# Patient Record
Sex: Male | Born: 1960 | Race: Black or African American | Hispanic: No | Marital: Married | Smoking: Never smoker
Health system: Southern US, Community
[De-identification: ages and names within clinical notes are randomized; demographics above are authoritative.]

## PROBLEM LIST (undated history)

## (undated) DIAGNOSIS — I10 Essential (primary) hypertension: Secondary | ICD-10-CM

## (undated) DIAGNOSIS — E119 Type 2 diabetes mellitus without complications: Secondary | ICD-10-CM

## (undated) DIAGNOSIS — F209 Schizophrenia, unspecified: Secondary | ICD-10-CM

## (undated) HISTORY — PX: TONSILLECTOMY: SUR1361

---

## 2007-10-29 ENCOUNTER — Emergency Department (HOSPITAL_BASED_OUTPATIENT_CLINIC_OR_DEPARTMENT_OTHER): Admission: EM | Admit: 2007-10-29 | Discharge: 2007-10-29 | Payer: Self-pay | Admitting: Emergency Medicine

## 2007-11-02 DIAGNOSIS — F329 Major depressive disorder, single episode, unspecified: Secondary | ICD-10-CM | POA: Insufficient documentation

## 2007-11-04 ENCOUNTER — Ambulatory Visit: Payer: Self-pay | Admitting: Critical Care Medicine

## 2007-11-04 DIAGNOSIS — Q330 Congenital cystic lung: Secondary | ICD-10-CM | POA: Insufficient documentation

## 2007-11-04 DIAGNOSIS — E785 Hyperlipidemia, unspecified: Secondary | ICD-10-CM | POA: Insufficient documentation

## 2008-02-01 ENCOUNTER — Telehealth (INDEPENDENT_AMBULATORY_CARE_PROVIDER_SITE_OTHER): Payer: Self-pay | Admitting: *Deleted

## 2008-02-02 ENCOUNTER — Ambulatory Visit: Payer: Self-pay | Admitting: Critical Care Medicine

## 2008-02-04 ENCOUNTER — Ambulatory Visit (HOSPITAL_COMMUNITY): Admission: RE | Admit: 2008-02-04 | Discharge: 2008-02-04 | Payer: Self-pay | Admitting: Critical Care Medicine

## 2008-02-08 ENCOUNTER — Encounter: Payer: Self-pay | Admitting: Critical Care Medicine

## 2008-10-03 ENCOUNTER — Ambulatory Visit: Payer: Self-pay | Admitting: Critical Care Medicine

## 2008-10-04 ENCOUNTER — Encounter: Payer: Self-pay | Admitting: Critical Care Medicine

## 2008-10-04 ENCOUNTER — Ambulatory Visit: Payer: Self-pay | Admitting: Diagnostic Radiology

## 2008-10-04 ENCOUNTER — Ambulatory Visit (HOSPITAL_BASED_OUTPATIENT_CLINIC_OR_DEPARTMENT_OTHER): Admission: RE | Admit: 2008-10-04 | Discharge: 2008-10-04 | Payer: Self-pay | Admitting: Critical Care Medicine

## 2008-10-19 ENCOUNTER — Ambulatory Visit: Payer: Self-pay | Admitting: Thoracic Surgery (Cardiothoracic Vascular Surgery)

## 2008-10-20 ENCOUNTER — Telehealth: Payer: Self-pay | Admitting: Critical Care Medicine

## 2008-12-28 ENCOUNTER — Ambulatory Visit: Payer: Self-pay | Admitting: Internal Medicine

## 2009-07-26 IMAGING — CT CT CHEST W/ CM
2 of 3 series · 15 of 36 positions shown, 18 images · IV contrast (agent unspecified)
Comparison: MR chest 02/04/2008 and CT chest 10/29/2007

CLINICAL DATA: Bronchogenic cyst.  Chest pain and discomfort.

CT CHEST WITH CONTRAST
TECHNIQUE: Multidetector CT imaging of the chest was performed
following the standard protocol during bolus administration of
intravenous contrast.
Contrast: 100 ml [id number]-ASS

[Series 2: chest 5.0 b31f · axial · 0.65mm/px · z∈[-350,-50]mm · 12 of 72 slices shown, 15 images]
[im 6/72  mediastinal]
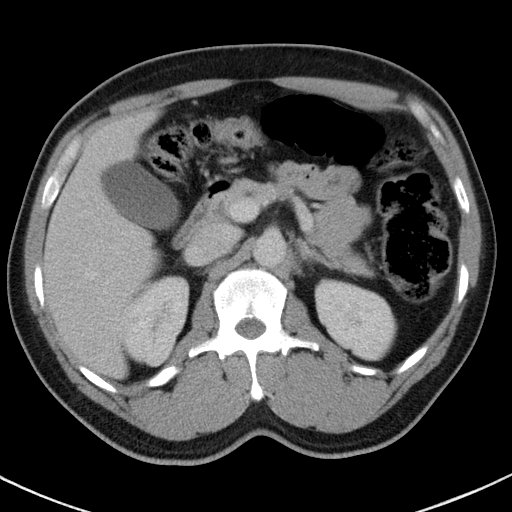
[im 6/72  lung]
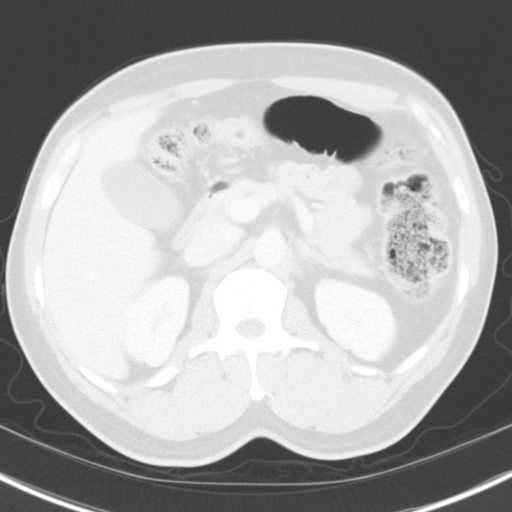
[im 11/72  lung]
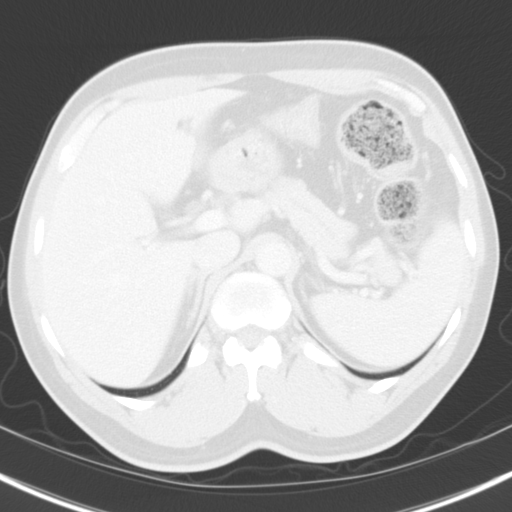
[im 16/72  lung]
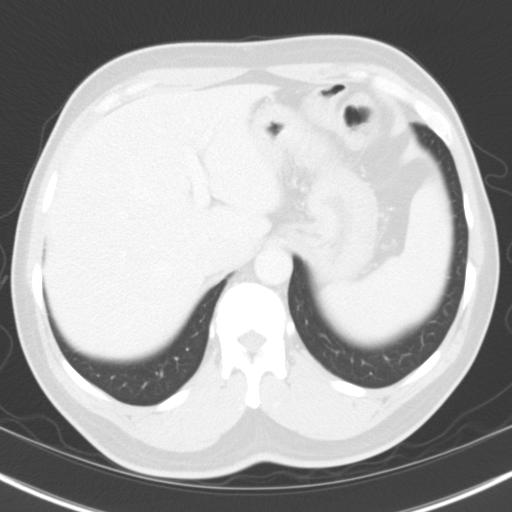
[im 22/72  lung]
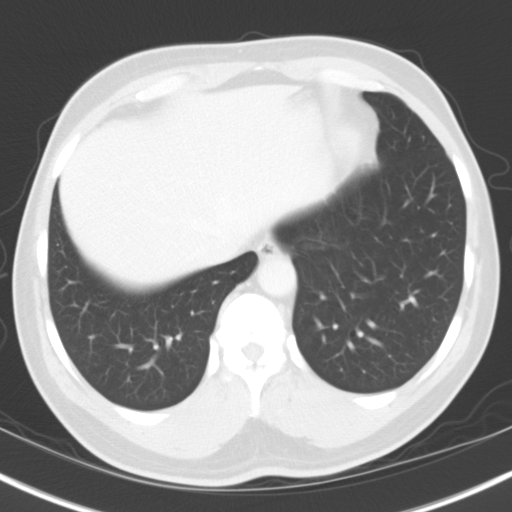
[im 27/72  mediastinal]
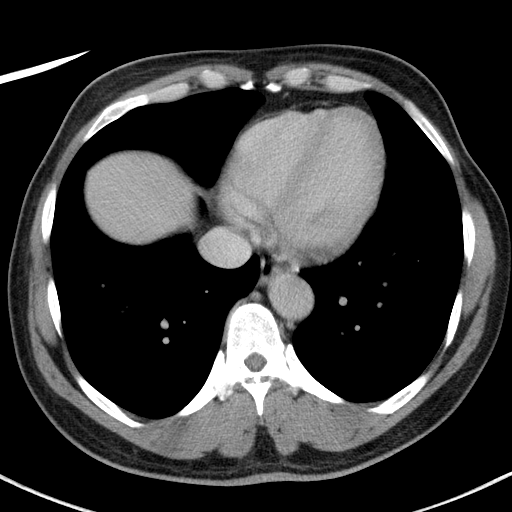
[im 27/72  lung]
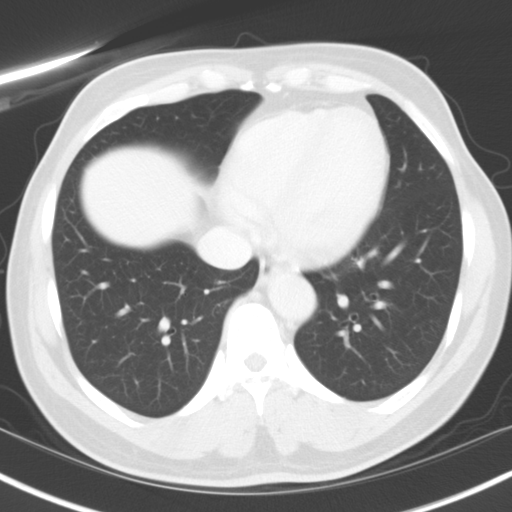
[im 32/72  lung]
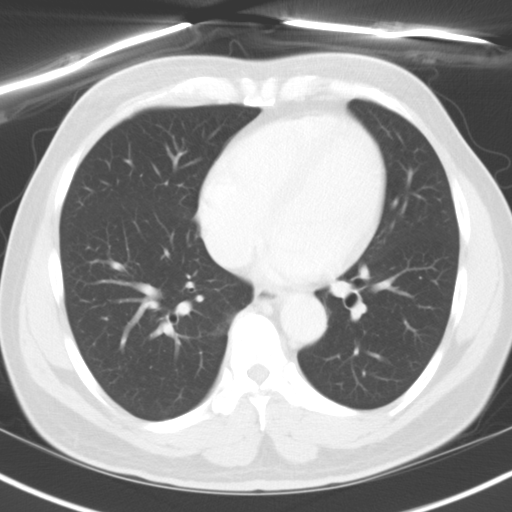
[im 40/72  lung]
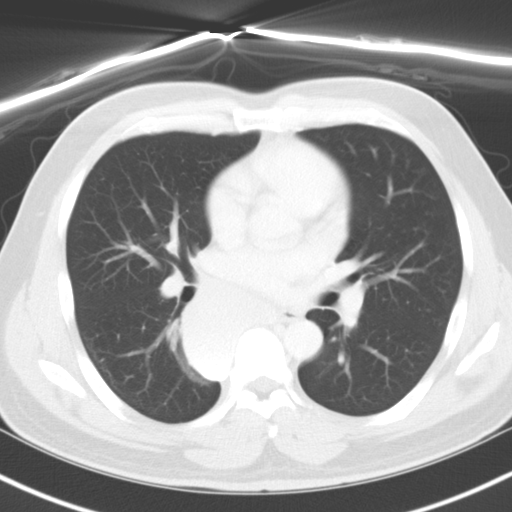
[im 45/72  lung]
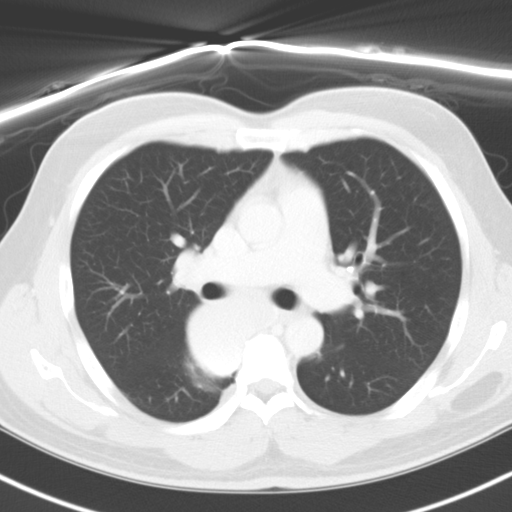
[im 50/72  mediastinal]
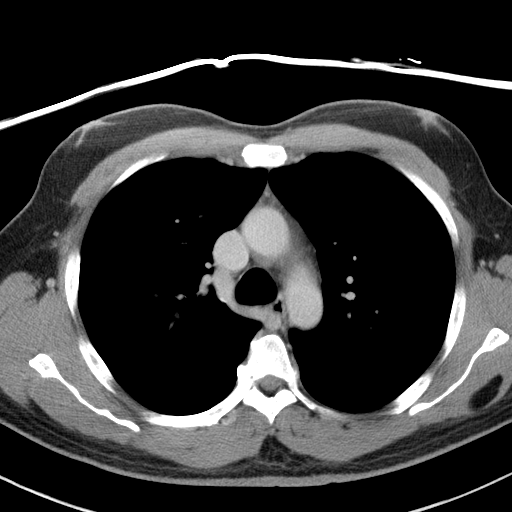
[im 50/72  lung]
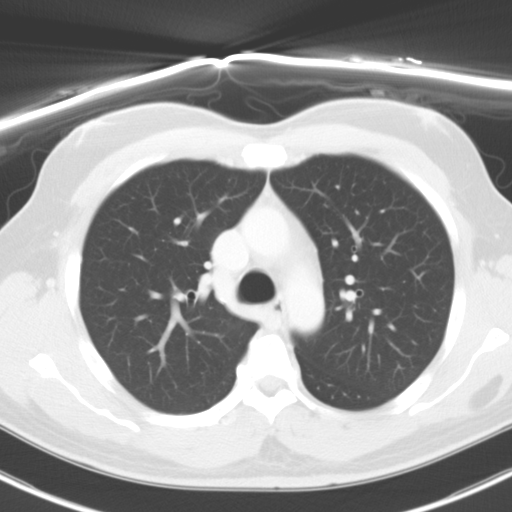
[im 56/72  lung]
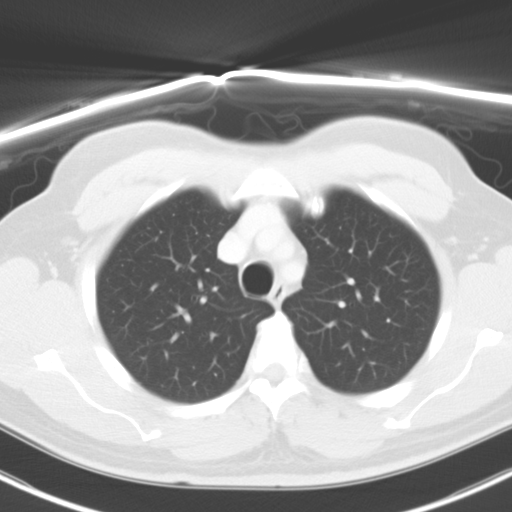
[im 61/72  lung]
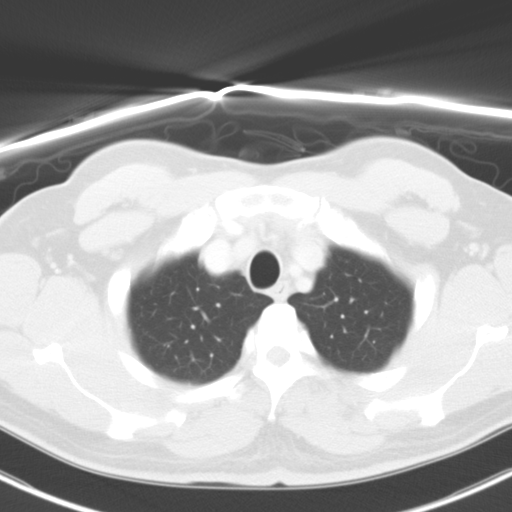
[im 66/72  lung]
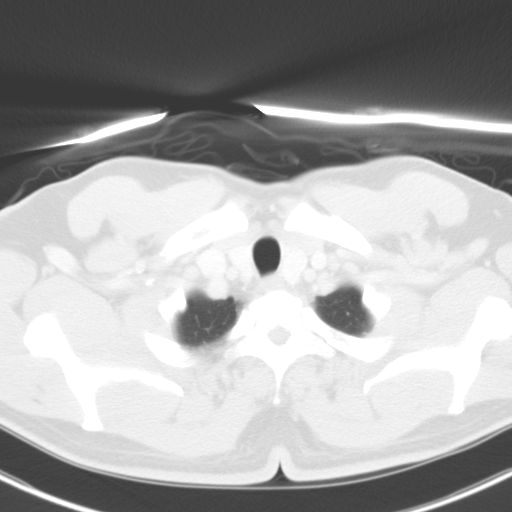

[Series 6: chest 3.0 coronal · coronal · 0.67mm/px · 3 of 84 slices shown]
[im 17/84  lung]
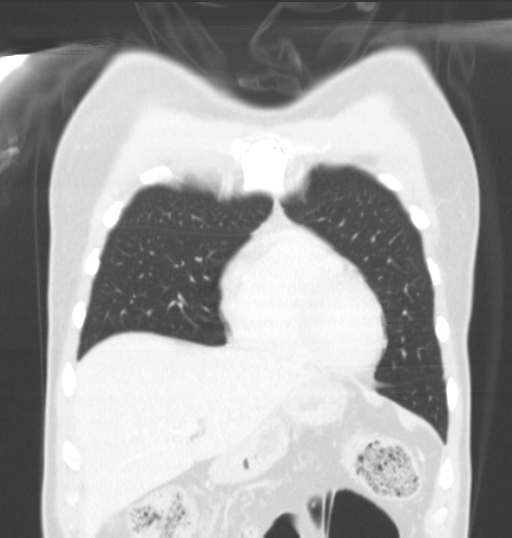
[im 34/84  lung]
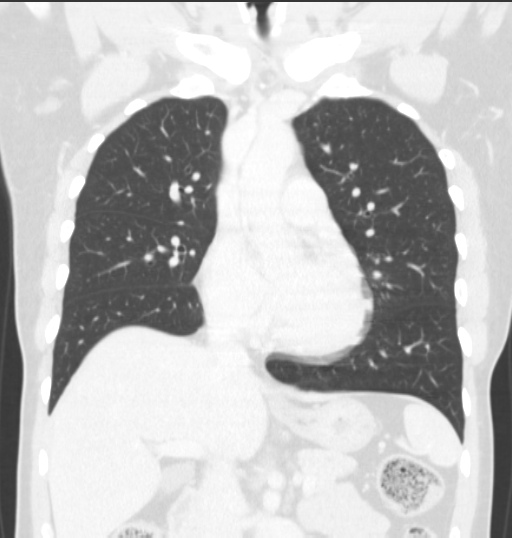
[im 50/84  lung]
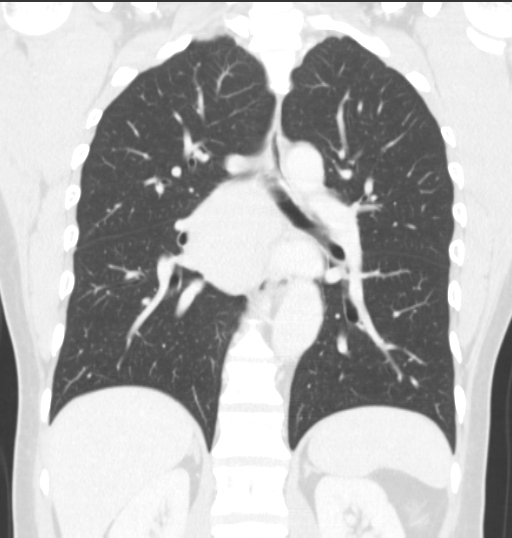

[15 of 36 positions shown; findings below may reference images not displayed]

FINDINGS: No pathologically enlarged mediastinal, hilar or axillary
lymph nodes.  There is bilateral gynecomastia.  A kidney-shaped
mass in the subcarinal region extends into the right hemithorax,
and measures 5.4 x 7.1 cm, stable.  It measures 100 HU, and there
is layering high-density debris.  Heart size normal.  No
pericardial effusion. A lipoma is incidentally noted in the left
infraspinatus muscle.

Mild biapical scarring.  Scarring is seen in the medial right lower
lobe.  Lungs are otherwise clear.  No pleural fluid.  Airway is
unremarkable.

Incidental imaging of the upper abdomen shows low attenuation
lesions in the liver measuring up to approximately 1 cm in size, in
the left hepatic lobe.  These are stable and likely small
hemangiomas.  No worrisome lytic or sclerotic lesions.
IMPRESSION: Complex cyst in the right subcarinal region is stable in size, and
most consistent with a bronchogenic/GI duplication cyst.

## 2010-04-29 ENCOUNTER — Encounter: Payer: Self-pay | Admitting: Critical Care Medicine

## 2010-08-20 NOTE — Consult Note (Signed)
NEW PATIENT CONSULTATION   Terry Padilla, Terry Padilla  DOB:  1960/10/13                                        October 19, 2008  CHART #:  [id number]   REASON FOR CONSULTATION:  Bronchogenic cyst with pain.   HISTORY OF PRESENT ILLNESS:  The patient is a 50 year old gentleman  originally from [place] who presents with a chief complaint of chest  pain, history regarding his mediastinal mass dates back about a year  ago, when he was having a sensation of fullness after eating.  He says  he did not have any dysphagia but after he would eat he would feel full,  had some chest discomfort, also feeling like he had the belch.  Workup  at that time revealed a large mediastinal cystic structure.  His  symptoms from that improved, and he did well until about 4 months ago,  when he started having chest pain.  He described this as a discomfort as  sharp pain that is changed with changes in position, particularly  associated with eating.  He did have a pleuritic component with pain  with deep breathing.  He has been taking Advil and that now has improved  over the time.  Repeat CT was done and he was referred to Dr. Delford Field.  The CT showed a large cystic mass in the mediastinum, in the subcarinal  region biased to the right.  This had different densities but was thin  walled with no enhancement.   PAST MEDICAL HISTORY:  Significant for hyperlipidemia, depression, and  schizophrenia, prostatitis, mediastinal cyst.   PAST SURGICAL HISTORY:  Significant for tonsillectomy.   FAMILY HISTORY:  Noncontributory.   SOCIAL HISTORY:  He is married with 2 children.  He works with the  [employer] as an Art gallery manager.  He does not smoke or drink.   REVIEW OF SYSTEMS:  Other than what was noted in the history and  physical, the patient denies a history of excessive bleeding, clotting,  stroke, or TIA symptoms, any other shortness of breath either at rest or  exertion, any other chest pain other than  that described in the HPI.  He  is not having any dysphagia and no indigestion or heartburn.  All other  systems are negative.   PHYSICAL EXAMINATION:  General:  The patient is a tall, thin, 48-year-  old male in no acute distress.  Vital Signs:  Blood pressure is 126/76,  pulse 72, respirations 16, his oxygen saturation is 99% on room air.  Neurologic:  He is alert.  He is oriented x3.  He has had no focal  deficits.  HEENT: Unremarkable.  Neck:  Without thyromegaly, adenopathy,  or bruits.  Cardiac:  Regular rate and rhythm.  Normal S1 and S2.  No  rubs, murmurs, or gallops.  Lungs:  Clear with equal breath sounds  bilaterally.  Extremities:  Without clubbing, cyanosis, or edema.   IMAGING DATA:  CT is reviewed.  It shows a thin-walled cystic structure,  which is fluid filled with variable enhancement measuring maximally 7 cm  x 5.4 cm.  This is an esophageal reflux and it abuts the subcarinal  space as well as the esophagus.  It is adjacent to the spine in the left  atrium and does project into the right chest.   IMPRESSION:  The patient is a  50 year old gentleman with a mediastinal  cyst.  This is most likely is a bronchogenic cyst, although it could  possibly be a duplication cyst.  In either event, I have recommended  resection due to the risk of secondary infection.  I would at the time  of surgery recommend bronchoscopy to see if there are any bronchial  connections followed by a right VATS with a mini thoracotomy and  resection of the cyst.  There is a possibility that he could require a  thoracotomy or division of a rib to assist with exposure given the size  of the cyst and that would greatly depend on whether there was any  adhesions to surrounding structures.  I did discuss with him the  possibility of secondary infection or a low likelihood but not  impossible risk of malignant transformation if left untreated.  I  discussed with him potential risk of the surgery.  He  does understand  the need for general anesthesia, operative approach to be used, expected  hospital stay, and overall recovery.  He does understand the risks  include but not be limited to a very low risk of death as well as risk  of MI, DVT, PE, bleeding, possible need for transfusions, infections,  air leak, esophageal injury or other unforeseeable complications.  He  understands these risks.  He has not currently decided as to whether he  wants to proceed with surgery.  I strongly encouraged him to but he  wishes to speak with Dr. Delford Field once again, and I gave him my card with  number.  I will be happy to answer any questions for him and be happy to  schedule surgery at anytime that is convenient for him if he does choose  to proceed.   Salvatore Decent Dorris Fetch, M.D.  Electronically Signed   SCH/MEDQ  D:  10/19/2008  T:  10/20/2008  Job:  [id number]   cc:   Charlcie Cradle. Delford Field, MD, FCCP  Robert P. Merla Riches, M.D.

## 2011-01-03 LAB — CBC
Hemoglobin: 14.3
MCHC: 34.6
MCV: 88.2
RBC: 4.68
RDW: 12

## 2011-01-03 LAB — DIFFERENTIAL
Basophils Absolute: 0
Basophils Relative: 0
Eosinophils Absolute: 0.2
Eosinophils Relative: 2
Lymphocytes Relative: 31
Neutro Abs: 3.9
Neutrophils Relative %: 59

## 2011-01-03 LAB — BASIC METABOLIC PANEL
BUN: 13
Creatinine, Ser: 1.2
Glucose, Bld: 106 — ABNORMAL HIGH
Sodium: 140

## 2011-01-03 LAB — POCT CARDIAC MARKERS: Troponin i, poc: <0.05

## 2013-02-07 ENCOUNTER — Ambulatory Visit (INDEPENDENT_AMBULATORY_CARE_PROVIDER_SITE_OTHER): Payer: Commercial Managed Care - PPO | Admitting: Family Medicine

## 2013-02-07 VITALS — BP 130/80 | HR 71 | Temp 98.1°F | Resp 16 | Ht 76.0 in | Wt 231.0 lb

## 2013-02-07 DIAGNOSIS — R05 Cough: Secondary | ICD-10-CM

## 2013-02-07 NOTE — Patient Instructions (Signed)
Check your blood pressure once a week or so and keep a record.  We would like for your to run around 140/85 or less.  If you find that you are consistently above this number please start on the medication that you were given.    It would be a good idea to re-establish care with a primary care doctor and have a complete physical.

## 2013-02-07 NOTE — Progress Notes (Signed)
[facility] [address], [place] [address] [phone]  Date:  02/07/2013   Name:  Terry Padilla   DOB:  01-18-1961   MRN:  [id number]  PCP:  No primary provider on file.    Chief Complaint: Blood Pressure Check   History of Present Illness:  Terry Padilla is a 52 y.o. very pleasant male patient who presents with the following:  Here today for a BP check.  2 days ago he noted a HA.  He took some advil and went to sleep.  His wife checked his BP with a wrist cuff and he was running 170/130.  He went to a doctor yesterday and his BP was 156/96.  Yesterday he was rx a medication but actually did not start it.  He is not sure of what medication it was.   He would like to try adjusting his diet and see if he can manage his BP without drugs.  Also plans to get into a better exercise program  He has never been dx with HTN in the past.  He does note a family history of HTN in both his parents The HA is resolved.  No CP.   Otherwise he feels well He does not have a PCP now and has not had a CPE in some time  Patient Active Problem List   Diagnosis Date Noted  . HYPERLIPIDEMIA 11/04/2007  . 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 DEPRESSION 11/02/2007    History reviewed. No pertinent past medical history.  History reviewed. No pertinent past surgical history.  History  Substance Use Topics  . Smoking status: Never Smoker   . Smokeless tobacco: Not on file  . Alcohol Use: No    History reviewed. No pertinent family history.  Not on File  Medication list has been reviewed and updated.  No current outpatient prescriptions on file prior to visit.   No current facility-administered medications on file prior to visit.    Review of Systems:  As per HPI- otherwise negative.   Physical Examination: Filed Vitals:   02/07/13 1357  BP: 130/80  Pulse: 71  Temp: 98.1 F (36.7 C)  Resp: 16   Filed Vitals:   02/07/13 1357  Height: 6\' 4"  (1.93 m)   Weight: 231 lb (104.781 kg)   Body mass index is 28.13 kg/(m^2). Ideal Body Weight: Weight in (lb) to have BMI = 25: 205  GEN: WDWN, NAD, Non-toxic, A & O x 3, tall, looks well HEENT: Atraumatic, Normocephalic. Neck supple. No masses, No LAD. Ears and Nose: No external deformity. CV: RRR, No M/G/R. No JVD. No thrill. No extra heart sounds. PULM: CTA B, no wheezes, crackles, rhonchi. No retractions. No resp. distress. No accessory muscle use. EXTR: No c/c/e NEURO Normal gait.  PSYCH: Normally interactive. Conversant. Not depressed or anxious appearing.  Calm demeanor.    Assessment and Plan: Elevated BP  Today his BP looks ok. He would like to avoid medication if possible.  Encouraged him to decrease his salt intake, exercise more and check his BP at least once a week.  If his BP is running more than 140/90 on a consistent basis he should start the medication.   Encouraged a CPE soon  Signed Abbe Amsterdam, MD

## 2013-11-03 ENCOUNTER — Encounter (HOSPITAL_COMMUNITY): Payer: Self-pay | Admitting: Emergency Medicine

## 2013-11-03 ENCOUNTER — Emergency Department (HOSPITAL_COMMUNITY)
Admission: EM | Admit: 2013-11-03 | Discharge: 2013-11-04 | Disposition: A | Discharge status: Other Acute Inpatient Hospital | Payer: Commercial Managed Care - PPO | Attending: Emergency Medicine | Admitting: Emergency Medicine

## 2013-11-03 DIAGNOSIS — F319 Bipolar disorder, unspecified: Secondary | ICD-10-CM

## 2013-11-03 DIAGNOSIS — F209 Schizophrenia, unspecified: Secondary | ICD-10-CM | POA: Diagnosis not present

## 2013-11-03 DIAGNOSIS — Z046 Encounter for general psychiatric examination, requested by authority: Secondary | ICD-10-CM | POA: Insufficient documentation

## 2013-11-03 DIAGNOSIS — F919 Conduct disorder, unspecified: Secondary | ICD-10-CM | POA: Diagnosis not present

## 2013-11-03 DIAGNOSIS — Z79899 Other long term (current) drug therapy: Secondary | ICD-10-CM | POA: Insufficient documentation

## 2013-11-03 DIAGNOSIS — R4689 Other symptoms and signs involving appearance and behavior: Secondary | ICD-10-CM

## 2013-11-03 DIAGNOSIS — F3112 Bipolar disorder, current episode manic without psychotic features, moderate: Secondary | ICD-10-CM | POA: Diagnosis present

## 2013-11-03 DIAGNOSIS — F259 Schizoaffective disorder, unspecified: Secondary | ICD-10-CM

## 2013-11-03 HISTORY — DX: Schizophrenia, unspecified: F20.9

## 2013-11-03 LAB — COMPREHENSIVE METABOLIC PANEL
ALK PHOS: 80 U/L (ref 39–117)
ALT: 36 U/L (ref 0–53)
AST: 52 U/L — AB (ref 0–37)
Albumin: 4.3 g/dL (ref 3.5–5.2)
Anion gap: 14 (ref 5–15)
BILIRUBIN TOTAL: 0.8 mg/dL (ref 0.3–1.2)
BUN: 13 mg/dL (ref 6–23)
CHLORIDE: 102 meq/L (ref 96–112)
CO2: 25 meq/L (ref 19–32)
Calcium: 9.8 mg/dL (ref 8.4–10.5)
Creatinine, Ser: 1.2 mg/dL (ref 0.50–1.35)
GFR, EST AFRICAN AMERICAN: 79 mL/min — AB (ref >90)
GFR, EST NON AFRICAN AMERICAN: 68 mL/min — AB (ref >90)
GLUCOSE: 118 mg/dL — AB (ref 70–99)
Potassium: 3.8 mEq/L (ref 3.7–5.3)
Sodium: 141 mEq/L (ref 137–147)
Total Protein: 7.7 g/dL (ref 6.0–8.3)

## 2013-11-03 LAB — CBC
HCT: 43.4 % (ref 39.0–52.0)
Hemoglobin: 14.7 g/dL (ref 13.0–17.0)
MCH: 31.2 pg (ref 26.0–34.0)
MCHC: 33.9 g/dL (ref 30.0–36.0)
MCV: 92.1 fL (ref 78.0–100.0)
PLATELETS: 179 10*3/uL (ref 150–400)
RBC: 4.71 MIL/uL (ref 4.22–5.81)
RDW: 12.8 % (ref 11.5–15.5)
WBC: 8 10*3/uL (ref 4.0–10.5)

## 2013-11-03 LAB — RAPID URINE DRUG SCREEN, HOSP PERFORMED
Amphetamines: NOT DETECTED
BENZODIAZEPINES: NOT DETECTED
Barbiturates: NOT DETECTED
Cocaine: NOT DETECTED
Opiates: NOT DETECTED
TETRAHYDROCANNABINOL: NOT DETECTED

## 2013-11-03 LAB — ACETAMINOPHEN LEVEL: Acetaminophen (Tylenol), Serum: <15 ug/mL (ref 10–30)

## 2013-11-03 LAB — ETHANOL

## 2013-11-03 LAB — LITHIUM LEVEL: Lithium Lvl: <0.25 mEq/L — ABNORMAL LOW (ref 0.80–1.40)

## 2013-11-03 LAB — SALICYLATE LEVEL

## 2013-11-03 MED ORDER — QUETIAPINE FUMARATE 300 MG PO TABS
300.0000 mg | ORAL_TABLET | Freq: Every day | ORAL | Status: DC
  Administered 2013-11-03: 300 mg via ORAL
  Filled 2013-11-03: qty 1

## 2013-11-03 MED ORDER — ACETAMINOPHEN 325 MG PO TABS
650.0000 mg | ORAL_TABLET | ORAL | Status: DC | PRN
  Administered 2013-11-03: 650 mg via ORAL
  Filled 2013-11-03: qty 2

## 2013-11-03 MED ORDER — ONDANSETRON HCL 4 MG PO TABS: 4.0000 mg | ORAL_TABLET | Freq: Three times a day (TID) | ORAL | Status: DC | PRN

## 2013-11-03 MED ORDER — LORAZEPAM 2 MG/ML IJ SOLN
INTRAMUSCULAR | Status: AC
  Administered 2013-11-03: 15:00:00
  Filled 2013-11-03: qty 1

## 2013-11-03 MED ORDER — HALOPERIDOL LACTATE 5 MG/ML IJ SOLN
INTRAMUSCULAR | Status: AC
  Administered 2013-11-03: 15:00:00
  Filled 2013-11-03: qty 1

## 2013-11-03 MED ORDER — LITHIUM CARBONATE ER 450 MG PO TBCR
900.0000 mg | EXTENDED_RELEASE_TABLET | Freq: Every day | ORAL | Status: DC
  Administered 2013-11-03: 900 mg via ORAL
  Filled 2013-11-03 (×2): qty 2

## 2013-11-03 MED ORDER — NICOTINE 21 MG/24HR TD PT24
21.0000 mg | MEDICATED_PATCH | Freq: Every day | TRANSDERMAL | Status: DC
  Administered 2013-11-04: 21 mg via TRANSDERMAL
  Filled 2013-11-03: qty 1

## 2013-11-03 MED ORDER — IBUPROFEN 200 MG PO TABS
600.0000 mg | ORAL_TABLET | Freq: Three times a day (TID) | ORAL | Status: DC | PRN
Start: 2013-11-03 — End: 2013-11-04
  Administered 2013-11-03: 600 mg via ORAL
  Filled 2013-11-03: qty 3

## 2013-11-03 MED ORDER — LORAZEPAM 1 MG PO TABS
1.0000 mg | ORAL_TABLET | Freq: Three times a day (TID) | ORAL | Status: DC | PRN
  Administered 2013-11-03 – 2013-11-04 (×2): 1 mg via ORAL
  Filled 2013-11-03 (×2): qty 1

## 2013-11-03 MED ORDER — DIPHENHYDRAMINE HCL 50 MG/ML IJ SOLN
INTRAMUSCULAR | Status: AC
  Administered 2013-11-03: 15:00:00
  Filled 2013-11-03: qty 1

## 2013-11-03 MED ORDER — ZOLPIDEM TARTRATE 5 MG PO TABS
5.0000 mg | ORAL_TABLET | Freq: Every evening | ORAL | Status: DC | PRN
Start: 2013-11-03 — End: 2013-11-03

## 2013-11-03 MED ORDER — ALUM & MAG HYDROXIDE-SIMETH 200-200-20 MG/5ML PO SUSP: 30.0000 mL | ORAL | Status: DC | PRN

## 2013-11-03 NOTE — ED Notes (Signed)
Ok per patient to release valuables to wife Terry Padilla. Valuables returned to wife. Wife states patient has spent over 200,000.00 in the last week.

## 2013-11-03 NOTE — BH Assessment (Signed)
Assessment Note  Terry Padilla is an 53 y.o. male. Pt presents under IVC taken out by his wife. Per IVC, pt has been dx with bipolar and under care of psychiatrist. Per IVC, pt refusing psych meds and [place] highway 10[place]30[place]13. Pt is cooperative and talkative at time of assessment. He says, "I am having trouble with my wife". Pt goes on to tell writer in detail about wife going back on her word when she promised to convert to [place] he and wife traveled to [place] last week and he lost his wallet, credit card and cell phone.  Pt sts that he flew to [place] one time in order to shake Obama's hand so pt could "transfer my power" to President. Pt sts his sister is flying from [place] to [place] today to see pt. Pt sts that if sister doesn't come to visit pt, then their mother will die. 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 When asked whether he spent $200,000 in in the past week, pt sts he did. Pt denies he spent the money on prostitutes. Pt says, "They were people I know.". Pt says he didn't buy gifts for the people. He says he bought them food at the [employer]. Pt sts that he knows he will win the [place]. He says, "I spend money before I have it." Pt denies SI and HI. He denies [facility]. Pt sts he will no longer live with his wife and two children. Pt denies having a psychiatrist and sts that he has no hx of inpatient treatment.   Axis I: Bipolar I Disorder Axis II: Deferred Axis III:  Past Medical History  Diagnosis Date  . Schizophrenia    Axis IV: other psychosocial or environmental problems, problems related to social environment and problems with primary support group Axis V: 31-40 impairment in reality testing  Past Medical History:  Past Medical History  Diagnosis Date  . Schizophrenia     Past Surgical History  Procedure Laterality Date  . Tonsillectomy      Family History: No family history on file.  Social History:  reports that he has  never smoked. He does not have any smokeless tobacco history on file. He reports that he does not drink alcohol or use illicit drugs.  Additional Social History:  Alcohol / Drug Use Pain Medications: see PTA meds list - pt denies abuse Prescriptions: see PTA meds list - pt denies abuse Over the Counter: see PTA meds list - pt denies abuse History of alcohol / drug use?: No history of alcohol / drug abuse  CIWA: CIWA-Ar BP: 148/101 mmHg Pulse Rate: 80 COWS:    Allergies: No Known Allergies  Home Medications:  (Not in a hospital admission)  OB/GYN Status:  No LMP for male patient.  General Assessment Data Location of Assessment: [facility] Is this a Tele or Face-to-Face Assessment?: Face-to-Face Is this an Initial Assessment or a Re-assessment for this encounter?: Initial Assessment Living Arrangements: Spouse/significant other;Children (wife, kids (78 yo daughter, 66 yo son)) Can pt return to current living arrangement?: Yes Admission Status: Involuntary Is patient capable of signing voluntary admission?: Yes Transfer from: Home Referral Source: Self/Family/Friend     [place] Living Arrangements: Spouse/significant other;Children (wife, kids (22 yo daughter, 86 yo son)) Name of Psychiatrist: none Name of Therapist: none  Education Status Is patient currently in school?: No  Risk to self with the past 6 months Suicidal Ideation: No Suicidal  Intent: No Is patient at risk for suicide?: No Suicidal Plan?: No Access to Means: No What has been your use of drugs/alcohol within the last 12 months?: none Previous Attempts/Gestures: No How many times?: 0 Other Self Harm Risks: none Triggers for Past Attempts:  (n/a) Intentional Self Injurious Behavior: None Family Suicide History: No Recent stressful life event(s): Conflict (Comment);Other (Comment) (wife is putting pressure on him) Persecutory voices/beliefs?: No Depression: No Substance abuse history and/or  treatment for substance abuse?: No Suicide prevention information given to non-admitted patients: Not applicable  Risk to Others within the past 6 months Homicidal Ideation: No Thoughts of Harm to Others: No Current Homicidal Intent: No Current Homicidal Plan: No Access to Homicidal Means: No Identified Victim: none History of harm to others?: No Assessment of Violence: None Noted Violent Behavior Description: pt denies hx violence - is calm Does patient have access to weapons?: No Criminal Charges Pending?: No Does patient have a court date: No  Psychosis Hallucinations: None noted Delusions: Grandiose (pt shook Obama's hand to give president pt's power, )  Mental Status Report Appear/Hygiene: Unremarkable;In scrubs Eye Contact: Good Motor Activity: Freedom of movement;Hyperactivity Speech: Logical/coherent;Loud Level of Consciousness: Alert Mood: Euthymic Affect: Appropriate to circumstance;Other (Comment) (euthymic) Anxiety Level: None Thought Processes: Coherent;Relevant Judgement: Impaired Orientation: Person;Place;Time Obsessive Compulsive Thoughts/Behaviors: None  Cognitive Functioning Concentration: Normal Memory: Recent Intact;Remote Intact IQ: Average Insight: Poor Impulse Control: Fair Appetite: Good Sleep: No Change Total Hours of Sleep: 6 Vegetative Symptoms: None  ADLScreening [facility] Assessment Services) Patient's cognitive ability adequate to safely complete daily activities?: Yes Patient able to express need for assistance with ADLs?: Yes Independently performs ADLs?: Yes (appropriate for developmental age)  Prior Inpatient Therapy Prior Inpatient Therapy: No Prior Therapy Dates: na Prior Therapy Facilty/Provider(s): na Reason for Treatment: na  Prior Outpatient Therapy Prior Outpatient Therapy: No Prior Therapy Dates: na Prior Therapy Facilty/Provider(s): na Reason for Treatment: na  ADL Screening (condition at time of  admission) Patient's cognitive ability adequate to safely complete daily activities?: Yes Is the patient deaf or have difficulty hearing?: No Does the patient have difficulty seeing, even when wearing glasses/contacts?: No Does the patient have difficulty concentrating, remembering, or making decisions?: No Patient able to express need for assistance with ADLs?: Yes Does the patient have difficulty dressing or bathing?: No Independently performs ADLs?: Yes (appropriate for developmental age) Does the patient have difficulty walking or climbing stairs?: No Weakness of Legs: None Weakness of Arms/Hands: None  Home Assistive Devices/Equipment Home Assistive Devices/Equipment: None    Abuse/Neglect Assessment (Assessment to be complete while patient is alone) Physical Abuse: Denies Verbal Abuse: Denies Sexual Abuse: Denies Exploitation of patient/patient's resources: Denies Self-Neglect: Denies Values / Beliefs Cultural Requests During Hospitalization: None Spiritual Requests During Hospitalization: None   Advance Directives (For Healthcare) Advance Directive: Patient does not have advance directive    Additional Information 1:1 In Past 12 Months?: No CIRT Risk: No Elopement Risk: No Does patient have medical clearance?: Yes     Disposition:  Disposition Initial Assessment Completed for this Encounter: Yes Disposition of Patient:  (pt to be evaluated to psychiatry this am)  On Site Evaluation by:   Reviewed with Physician:    Leron Croak P 11/03/2013 8:41 AM

## 2013-11-03 NOTE — ED Notes (Signed)
Pt has been given scrubs to change into and a bag for his personal belongings.

## 2013-11-03 NOTE — ED Notes (Signed)
Pt walked to desk wrapped in blanket only. Tech John explained to pt that he must keep scrubs on in dept.

## 2013-11-03 NOTE — ED Provider Notes (Signed)
Medical screening examination/treatment/procedure(s) were performed by non-physician practitioner and as supervising physician I was immediately available for consultation/collaboration.   EKG Interpretation None       Kalman Drape, MD 11/03/13 [phone]

## 2013-11-03 NOTE — ED Notes (Signed)
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 Pt denies SI/HI.  Pts wife reportedly going to magistrate to obtain IVC, Probation officer unable to get answer on telephone.  Pts wife called from pts phone, wife Terry Padilla) states she is at the [employer] signing papers now.  Pt pleasant and cooperative.

## 2013-11-03 NOTE — ED Notes (Signed)
Pt awake, alert & responsive at present, cooperative, pt briefly talked on the phone with family.  Will continue to monitor for safety.

## 2013-11-03 NOTE — ED Notes (Signed)
Wife/Pat at bedside visiting with pt at present.

## 2013-11-03 NOTE — ED Notes (Signed)
Pt has been wanded by security and belongings checked.

## 2013-11-03 NOTE — ED Notes (Signed)
Belongings currently at nurses station with label.

## 2013-11-03 NOTE — ED Notes (Signed)
Pt. To [facility] from ED ambulatory without difficulty. Pt. Is alert and orinted, warm and dry in no distress. Pt. Denies SI, HI, and AVH. Pt. Calm and cooperative. Pt. Encouraged to let Nursing staff know if he has concerns or needs.

## 2013-11-03 NOTE — ED Notes (Signed)
Pt signed consent to release form.

## 2013-11-03 NOTE — Progress Notes (Signed)
[facility] provided pt with a [employer], highlighting [facility] [place], to help patient establish primary care.

## 2013-11-03 NOTE — ED Provider Notes (Signed)
CSN: [id number]     Arrival date & time 11/03/13  0011 History   First MD Initiated Contact with Patient 11/03/13 0013     Chief Complaint  Patient presents with  . Medical Clearance    (Consider location/radiation/quality/duration/timing/severity/associated sxs/prior Treatment) HPI Comments: Patient is a 53 year old male with history of schizophrenia and bipolar who presents to the emergency department for medical clearance. Patient's wife is currently in the process of taking out IVC papers. Patient brought in by [employer]. PPD states that patient was at the [facility] trying to get a hotel room without ID or credit cards. Police state that patient has been acting fairly erratic while in their custody; he has been calm since arrival in the ED. Patient states that he lost his ID and credit cards in [place] last week. He states that he flew back to [place] 4 days ago. Patient states that he gets Prolixin injections for his schizophrenia. He states that he most recently got the shot when he came home. In patient's personal belongings, his phone has many photos of cars and bystanders that he was observing from a distance. He also has quite a large amount of cash on him. Patient denies SI/HI, etoh use, or illicit drug use. He states he likes to "rest on the 7th day the Lord made" and "My wife won't covert to my religion and I believe in unity". Patient at times appears confused when asked fact checking questions.  Life states patient has been acting erratic since their trip to [facility] one week ago. She states he was evaluated at [facility] for this upon returning home. At this time, his medications were changed, but patient has been noncompliant as he did not like how they made him feel. He has spent $200,000.00 in the last week, per wife.  The history is provided by the patient. No language interpreter was used.    Past Medical History  Diagnosis Date  . Schizophrenia    Past Surgical History  Procedure  Laterality Date  . Tonsillectomy     No family history on file. History  Substance Use Topics  . Smoking status: Never Smoker   . Smokeless tobacco: Not on file  . Alcohol Use: No    Review of Systems  Psychiatric/Behavioral: Positive for behavioral problems. Negative for suicidal ideas. The patient is not nervous/anxious.      Allergies  Review of patient's allergies indicates no known allergies.  Home Medications   Prior to Admission medications   Medication Sig Start Date End Date Taking? Authorizing Provider  fluPHENAZine decanoate (PROLIXIN) 25 MG/ML injection Inject 12.5 mg into the muscle every 6 (six) weeks.    Yes Historical Provider, MD  lithium carbonate (ESKALITH) 450 MG CR tablet Take 900 mg by mouth at bedtime. 11/01/13  Yes Historical Provider, MD  QUEtiapine (SEROQUEL) 300 MG tablet Take 300 mg by mouth at bedtime. 11/01/13  Yes Historical Provider, MD   BP 161/89  Pulse 110  Temp(Src) 98.6 F (37 C)  Resp 16  SpO2 98%  Physical Exam  Nursing note and vitals reviewed. Constitutional: He is oriented to person, place, and time. He appears well-developed and well-nourished. No distress.  HENT:  Head: Normocephalic and atraumatic.  Eyes: Conjunctivae and EOM are normal. No scleral icterus.  Neck: Normal range of motion.  Pulmonary/Chest: Effort normal. No respiratory distress.  Musculoskeletal: Normal range of motion.  Neurological: He is alert and oriented to person, place, and time.  Skin: Skin is warm and  dry. No rash noted. He is not diaphoretic. No erythema. No pallor.  Psychiatric: He has a normal mood and affect. His behavior is normal. His speech is rapid and/or pressured. Cognition and memory are normal. He expresses no homicidal and no suicidal ideation. He expresses no suicidal plans and no homicidal plans.  Patient with very tangential answers    ED Course  Procedures (including critical care time) Labs Review Labs Reviewed  COMPREHENSIVE  METABOLIC PANEL - Abnormal; Notable for the following:    Glucose, Bld 118 (*)    AST 52 (*)    GFR calc non Af Amer 68 (*)    GFR calc Af Amer 79 (*)    All other components within normal limits  SALICYLATE LEVEL - Abnormal; Notable for the following:    Salicylate Lvl <0.9 (*)    All other components within normal limits  LITHIUM LEVEL - Abnormal; Notable for the following:    Lithium Lvl <0.25 (*)    All other components within normal limits  ACETAMINOPHEN LEVEL  CBC  ETHANOL  URINE RAPID DRUG SCREEN (HOSP PERFORMED)    Imaging Review No results found.   EKG Interpretation None      MDM   Final diagnoses:  Abnormal behavior  Hx of schizophrenia    53 year old male with a history of schizophrenia presents to the emergency department under IVC. IVC papers taken out by wife. Patient initially presented via [employer] after he attempted to get a room at the [facility] without ID or credit cards. Patient claims to have lost his identification when he was in [place] for travel one week ago. Wife states the patient has been acting erratic since their trip. She states that the patient hired a driver to drive him around [place]. She also states that he has spent approximately $200,000 in the last week. Wife states that behavior change was addressed at [facility] after arriving home. Patient was supposed to be taking medications for his schizophrenia, but has been noncompliant because he didn't like how they made him feel.  Patient is very cooperative while in ED. Labs reviewed and patient medically cleared. His lithium level is noted to be subtherapeutic. Patient is currently pending TTS evaluation. Given behavior, inpatient tx is likely. Disposition to be determined by oncoming ED provider.   Filed Vitals:   11/03/13 0017  BP: 161/89  Pulse: 110  Temp: 98.6 F (37 C)  Resp: 16  SpO2: 98%       Antonietta Breach, PA-C 11/03/13 [phone]

## 2013-11-03 NOTE — ED Notes (Signed)
Pt to desk, stating his wife and sister are coming to visit him.  Pt spoke on phone with wife briefly.  Wife called back and stated she prefers to visit pt in am when she can speak with the Psychiatrist.

## 2013-11-03 NOTE — ED Notes (Addendum)
Patient angry and explosive.  Unable to redirect.  IM medication orders obtained.  Security/[employer] in for backup.

## 2013-11-03 NOTE — Consult Note (Signed)
[facility] Face-to-Face Psychiatry Consult   Reason for Consult:  Manic behavior Referring Physician:  ER MD  Terry Padilla is an 53 y.o. male. Total Time spent with patient: 45 minutes  Assessment: AXIS I:  schizoaffective disorder bipolar type AXIS II:  Deferred AXIS III:   Past Medical History  Diagnosis Date  . Schizophrenia    AXIS IV:  problems with chronic mental illness AXIS V:  41-50 serious symptoms  Plan:  Recommend psychiatric Inpatient admission when medically cleared.  Subjective:   Terry Padilla is a 53 y.o. male patient admitted with manic behaviors.  HPI:  Terry Padilla says he has no problems.  Says his wife has issues.  Where he comes from the wife should obey and his does not.  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  His wife reports he has been erratic, spending $200,000 in one week on prostitutes and other things.  He says he has spent that much money but not on prostitutes and that he earns it and it is his right to spend it.  He does see a psychiatrist and reportedly has been diagnosed with schizophrenia and bipolar.  He denies any problems and wants to be discharged. HPI Elements:   Location:  manic behaviors. Quality:  spending excessively. Severity:  grandiose beliefs about his powers. Timing:  no precipitants. Duration:  uncertain. Context:  diagnosed with schizophrenia and bipolar in the past.  Past Psychiatric History: Past Medical History  Diagnosis Date  . Schizophrenia     reports that he has never smoked. He does not have any smokeless tobacco history on file. He reports that he does not drink alcohol or use illicit drugs. No family history on file. Family History Substance Abuse: No Family Supports: Yes, List: (wife, sister) Living Arrangements:  Spouse/significant other;Children (wife, kids (58 yo daughter, 72 yo son)) Can pt return to current living arrangement?: Yes Abuse/Neglect [facility]) Physical Abuse: Denies Verbal Abuse: Denies Sexual Abuse: Denies Allergies:  No Known Allergies  ACT Assessment Complete:  Yes:    Educational Status    Risk to Self: Risk to self with the past 6 months Suicidal Ideation: No Suicidal Intent: No Is patient at risk for suicide?: No Suicidal Plan?: No Access to Means: No What has been your use of drugs/alcohol within the last 12 months?: none Previous Attempts/Gestures: No How many times?: 0 Other Self Harm Risks: none Triggers for Past Attempts:  (n/a) Intentional Self Injurious Behavior: None Family Suicide History: No Recent stressful life event(s): Conflict (Comment);Other (Comment) (wife is putting pressure on him) Persecutory voices/beliefs?: No Depression: No Substance abuse history and/or treatment for substance abuse?: No Suicide prevention information given to non-admitted patients: Not applicable  Risk to Others: Risk to Others within the past 6 months Homicidal Ideation: No Thoughts of Harm to Others: No Current Homicidal Intent: No Current Homicidal Plan: No Access to Homicidal Means: No Identified Victim: none History of harm to others?: No Assessment of Violence: None Noted Violent Behavior Description: pt denies hx violence - is calm Does patient have access to weapons?: No Criminal Charges Pending?: No Does patient have a court date: No  Abuse: Abuse/Neglect Assessment (Assessment to be complete while patient is alone) Physical Abuse: Denies Verbal Abuse: Denies Sexual Abuse: Denies Exploitation of patient/patient's resources: Denies  Self-Neglect: Denies  Prior Inpatient Therapy: Prior Inpatient Therapy Prior Inpatient Therapy: No Prior Therapy Dates: na Prior Therapy Facilty/Provider(s): na Reason for Treatment: na  Prior Outpatient Therapy: Prior Outpatient  Therapy Prior Outpatient Therapy: No Prior Therapy Dates: na Prior Therapy Facilty/Provider(s): na Reason for Treatment: na  Additional Information: Additional Information 1:1 In Past 12 Months?: No CIRT Risk: No Elopement Risk: No Does patient have medical clearance?: Yes                  Objective: Blood pressure 155/79, pulse 86, temperature 98.5 F (36.9 C), temperature source Oral, resp. rate 18, SpO2 96.00%.There is no weight on file to calculate BMI. Results for orders placed during the hospital encounter of 11/03/13 (from the past 72 hour(s))  ACETAMINOPHEN LEVEL     Status: None   Collection Time    11/03/13  1:04 AM      Result Value Ref Range   Acetaminophen (Tylenol), Serum <15.0  10 - 30 ug/mL   Comment:            THERAPEUTIC CONCENTRATIONS VARY     SIGNIFICANTLY. A RANGE OF 10-30     ug/mL MAY BE AN EFFECTIVE     CONCENTRATION FOR MANY PATIENTS.     HOWEVER, SOME ARE BEST TREATED     AT CONCENTRATIONS OUTSIDE THIS     RANGE.     ACETAMINOPHEN CONCENTRATIONS     >150 ug/mL AT 4 HOURS AFTER     INGESTION AND >50 ug/mL AT 12     HOURS AFTER INGESTION ARE     OFTEN ASSOCIATED WITH TOXIC     REACTIONS.  CBC     Status: None   Collection Time    11/03/13  1:04 AM      Result Value Ref Range   WBC 8.0  4.0 - 10.5 K/uL   RBC 4.71  4.22 - 5.81 MIL/uL   Hemoglobin 14.7  13.0 - 17.0 g/dL   HCT 43.4  39.0 - 52.0 %   MCV 92.1  78.0 - 100.0 fL   MCH 31.2  26.0 - 34.0 pg   MCHC 33.9  30.0 - 36.0 g/dL   RDW 12.8  11.5 - 15.5 %   Platelets 179  150 - 400 K/uL  COMPREHENSIVE METABOLIC PANEL     Status: Abnormal   Collection Time    11/03/13  1:04 AM      Result Value Ref Range   Sodium 141  137 - 147 mEq/L   Potassium 3.8  3.7 - 5.3 mEq/L   Chloride 102  96 - 112 mEq/L   CO2 25  19 - 32 mEq/L   Glucose, Bld 118 (*) 70 - 99 mg/dL   BUN 13  6 - 23 mg/dL   Creatinine, Ser 1.20  0.50 - 1.35 mg/dL   Calcium 9.8  8.4 - 10.5 mg/dL   Total Protein 7.7   6.0 - 8.3 g/dL   Albumin 4.3  3.5 - 5.2 g/dL   AST 52 (*) 0 - 37 U/L   ALT 36  0 - 53 U/L   Alkaline Phosphatase 80  39 - 117 U/L   Total Bilirubin 0.8  0.3 - 1.2 mg/dL   GFR calc non Af Amer 68 (*) >90 mL/min   GFR calc Af Amer 79 (*) >90 mL/min   Comment: (NOTE)     The eGFR has been calculated using the CKD EPI equation.     This calculation has  not been validated in all clinical situations.     eGFR's persistently <90 mL/min signify possible Chronic Kidney     Disease.   Anion gap 14  5 - 15  ETHANOL     Status: None   Collection Time    11/03/13  1:04 AM      Result Value Ref Range   Alcohol, Ethyl (B) <11  0 - 11 mg/dL   Comment:            LOWEST DETECTABLE LIMIT FOR     SERUM ALCOHOL IS 11 mg/dL     FOR MEDICAL PURPOSES ONLY  SALICYLATE LEVEL     Status: Abnormal   Collection Time    11/03/13  1:04 AM      Result Value Ref Range   Salicylate Lvl <2.9 (*) 2.8 - 20.0 mg/dL  LITHIUM LEVEL     Status: Abnormal   Collection Time    11/03/13  1:06 AM      Result Value Ref Range   Lithium Lvl <0.25 (*) 0.80 - 1.40 mEq/L  URINE RAPID DRUG SCREEN (HOSP PERFORMED)     Status: None   Collection Time    11/03/13  1:29 AM      Result Value Ref Range   Opiates NONE DETECTED  NONE DETECTED   Cocaine NONE DETECTED  NONE DETECTED   Benzodiazepines NONE DETECTED  NONE DETECTED   Amphetamines NONE DETECTED  NONE DETECTED   Tetrahydrocannabinol NONE DETECTED  NONE DETECTED   Barbiturates NONE DETECTED  NONE DETECTED   Comment:            DRUG SCREEN FOR MEDICAL PURPOSES     ONLY.  IF CONFIRMATION IS NEEDED     FOR ANY PURPOSE, NOTIFY LAB     WITHIN 5 DAYS.                LOWEST DETECTABLE LIMITS     FOR URINE DRUG SCREEN     Drug Class       Cutoff (ng/mL)     Amphetamine      1000     Barbiturate      200     Benzodiazepine   528     Tricyclics       413     Opiates          300     Cocaine          300     THC              50   Labs are reviewed and are pertinent for  no psychiatric issues.  Current Facility-Administered Medications  Medication Dose Route Frequency Provider Last Rate Last Dose  . acetaminophen (TYLENOL) tablet 650 mg  650 mg Oral Q4H PRN Antonietta Breach, PA-C   650 mg at 11/03/13 1042  . alum & mag hydroxide-simeth (MAALOX/MYLANTA) 200-200-20 MG/5ML suspension 30 mL  30 mL Oral PRN Antonietta Breach, PA-C      . ibuprofen (ADVIL,MOTRIN) tablet 600 mg  600 mg Oral Q8H PRN Antonietta Breach, PA-C      . LORazepam (ATIVAN) tablet 1 mg  1 mg Oral Q8H PRN Antonietta Breach, PA-C   1 mg at 11/03/13 1042  . nicotine (NICODERM CQ - dosed in mg/24 hours) patch 21 mg  21 mg Transdermal Daily Antonietta Breach, PA-C      . ondansetron (ZOFRAN) tablet 4 mg  4 mg Oral Q8H PRN Antonietta Breach, PA-C      .  zolpidem (AMBIEN) tablet 5 mg  5 mg Oral QHS PRN Antonietta Breach, PA-C       Current Outpatient Prescriptions  Medication Sig Dispense Refill  . fluPHENAZine decanoate (PROLIXIN) 25 MG/ML injection Inject 12.5 mg into the muscle every 6 (six) weeks.       Marland Kitchen lithium carbonate (ESKALITH) 450 MG CR tablet Take 900 mg by mouth at bedtime.      Marland Kitchen QUEtiapine (SEROQUEL) 300 MG tablet Take 300 mg by mouth at bedtime.        Psychiatric Specialty Exam:     Blood pressure 155/79, pulse 86, temperature 98.5 F (36.9 C), temperature source Oral, resp. rate 18, SpO2 96.00%.There is no weight on file to calculate BMI.  General Appearance: Well Groomed  Engineer, water::  Good  Speech:  Clear and Coherent  Volume:  Normal  Mood:  Anxious  Affect:  Appropriate  Thought Process:  Coherent and Logical  Orientation:  Full (Time, Place, and Person)  Thought Content:  Negative  Suicidal Thoughts:  No  Homicidal Thoughts:  No  Memory:  Immediate;   Good Recent;   Good Remote;   Good  Judgement:  Fair  Insight:  Shallow  Psychomotor Activity:  Normal  Concentration:  Good  Recall:  Good  Fund of Knowledge:Good  Language: Good  Akathisia:  Negative  Handed:  Right  AIMS (if indicated):      Assets:  Communication [place] Talents/Skills Transportation Vocational/Educational  Sleep:      Musculoskeletal: Strength & Muscle Tone: within normal limits Gait & Station: normal Patient leans: N/A  Treatment Plan Summary: Daily contact with patient to assess and evaluate symptoms and progress in treatment Medication management seek inpatient bed for treatment of manic behavior  Andrika Peraza D 11/03/2013 1:02 PM

## 2013-11-03 NOTE — ED Notes (Signed)
Sister at bedside to visit with pt.

## 2013-11-04 ENCOUNTER — Encounter (HOSPITAL_COMMUNITY): Payer: Self-pay | Admitting: Psychiatry

## 2013-11-04 ENCOUNTER — Inpatient Hospital Stay: Payer: Self-pay | Admitting: Psychiatry

## 2013-11-04 DIAGNOSIS — F3112 Bipolar disorder, current episode manic without psychotic features, moderate: Secondary | ICD-10-CM | POA: Diagnosis present

## 2013-11-04 DIAGNOSIS — F3110 Bipolar disorder, current episode manic without psychotic features, unspecified: Secondary | ICD-10-CM

## 2013-11-04 MED ORDER — FLUPHENAZINE DECANOATE 25 MG/ML IJ SOLN
12.5000 mg | Freq: Once | INTRAMUSCULAR | Status: AC
  Administered 2013-11-04: 12.5 mg via INTRAMUSCULAR
  Filled 2013-11-04: qty 0.5

## 2013-11-04 NOTE — Consult Note (Signed)
[facility] Face-to-Face Psychiatry Consult   Reason for Consult:  Manic Referring Physician:  EDP  Terry Padilla is an 53 y.o. male. Total Time spent with patient: 20 minutes  Assessment: AXIS I:  Bipolar, Manic AXIS II:  Deferred AXIS III:   Past Medical History  Diagnosis Date  . Schizophrenia    AXIS IV:  economic problems, housing problems, occupational problems, other psychosocial or environmental problems, problems related to social environment and problems with primary support group AXIS V:  21-30 behavior considerably influenced by delusions or hallucinations OR serious impairment in judgment, communication OR inability to function in almost all areas  Plan:  Recommend psychiatric Inpatient admission when medically cleared.  Dr. Dwyane Dee assessed the patient and concurs with the plan.  Subjective:   Terry Padilla is a 53 y.o. male patient admitted with mania, bipolar disorder.  HPI:  The patient has not been sleeping and started to spend excessive amounts of money.  Luckily, his wife saw him writing a $50,000 check to a lady in church and was able to stop his spending.  [name] has suffered with bipolar disorder for over twenty years and use to be regulated with Prolixin injectable 12.5 mg every 6 weeks until 2012.  He has had three episodes since this time.  The first one in 2012 resulted in his excessive spending to the point of bankruptcy for the family.  She states in these cases Prolixin has been increased to 25 mg with Lithium initiated for about a month and has stabilized on this regiment each time.  She took him to his psychiatrist, Dr. Estell Harpin, in [facility] and she started him on Lithium and Seroquel on Tuesday.  He had his last injection ten days ago of 12.5 mg of Prolixin.  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  Denies suicidal/homicidal ideations, hallucinations, and drug/alcohol use.  He is delusions  of grandeur, no insight to his current state of mania. HPI Elements:   Location:  generalized. Quality:  acute. Severity:  severe. Timing:  constant. Duration:  past week. Context:  stressors.  Past Psychiatric History: Past Medical History  Diagnosis Date  . Schizophrenia     reports that he has never smoked. He does not have any smokeless tobacco history on file. He reports that he does not drink alcohol or use illicit drugs. History reviewed. No pertinent family history. Family History Substance Abuse: No Family Supports: Yes, List: (wife, sister) Living Arrangements: Spouse/significant other;Children (wife, kids (12 yo daughter, 28 yo son)) Can pt return to current living arrangement?: Yes Abuse/Neglect [facility]) Physical Abuse: Denies Verbal Abuse: Denies Sexual Abuse: Denies Allergies:  No Known Allergies  ACT Assessment Complete:  Yes:    Educational Status    Risk to Self: Risk to self with the past 6 months Suicidal Ideation: No Suicidal Intent: No Is patient at risk for suicide?: No Suicidal Plan?: No Access to Means: No What has been your use of drugs/alcohol within the last 12 months?: none Previous Attempts/Gestures: No How many times?: 0 Other Self Harm Risks: none Triggers for Past Attempts:  (n/a) Intentional Self Injurious Behavior: None Family Suicide History: No Recent stressful life event(s): Conflict (Comment);Other (Comment) (wife is putting pressure on him) Persecutory voices/beliefs?: No Depression: No Substance abuse history and/or treatment for substance abuse?: No Suicide prevention information given to non-admitted patients: Not applicable  Risk to Others: Risk to Others  within the past 6 months Homicidal Ideation: No Thoughts of Harm to Others: No Current Homicidal Intent: No Current Homicidal Plan: No Access to Homicidal Means: No Identified Victim: none History of harm to others?: No Assessment of Violence: None Noted Violent Behavior  Description: pt denies hx violence - is calm Does patient have access to weapons?: No Criminal Charges Pending?: No Does patient have a court date: No  Abuse: Abuse/Neglect Assessment (Assessment to be complete while patient is alone) Physical Abuse: Denies Verbal Abuse: Denies Sexual Abuse: Denies Exploitation of patient/patient's resources: Denies Self-Neglect: Denies  Prior Inpatient Therapy: Prior Inpatient Therapy Prior Inpatient Therapy: No Prior Therapy Dates: na Prior Therapy Facilty/Provider(s): na Reason for Treatment: na  Prior Outpatient Therapy: Prior Outpatient Therapy Prior Outpatient Therapy: No Prior Therapy Dates: na Prior Therapy Facilty/Provider(s): na Reason for Treatment: na  Additional Information: Additional Information 1:1 In Past 12 Months?: No CIRT Risk: No Elopement Risk: No Does patient have medical clearance?: Yes                  Objective: Blood pressure 140/89, pulse 88, temperature 98.1 F (36.7 C), temperature source Oral, resp. rate 19, SpO2 99.00%.There is no weight on file to calculate BMI. Results for orders placed during the hospital encounter of 11/03/13 (from the past 72 hour(s))  ACETAMINOPHEN LEVEL     Status: None   Collection Time    11/03/13  1:04 AM      Result Value Ref Range   Acetaminophen (Tylenol), Serum <15.0  10 - 30 ug/mL   Comment:            THERAPEUTIC CONCENTRATIONS VARY     SIGNIFICANTLY. A RANGE OF 10-30     ug/mL MAY BE AN EFFECTIVE     CONCENTRATION FOR MANY PATIENTS.     HOWEVER, SOME ARE BEST TREATED     AT CONCENTRATIONS OUTSIDE THIS     RANGE.     ACETAMINOPHEN CONCENTRATIONS     >150 ug/mL AT 4 HOURS AFTER     INGESTION AND >50 ug/mL AT 12     HOURS AFTER INGESTION ARE     OFTEN ASSOCIATED WITH TOXIC     REACTIONS.  CBC     Status: None   Collection Time    11/03/13  1:04 AM      Result Value Ref Range   WBC 8.0  4.0 - 10.5 K/uL   RBC 4.71  4.22 - 5.81 MIL/uL   Hemoglobin 14.7   13.0 - 17.0 g/dL   HCT 43.4  39.0 - 52.0 %   MCV 92.1  78.0 - 100.0 fL   MCH 31.2  26.0 - 34.0 pg   MCHC 33.9  30.0 - 36.0 g/dL   RDW 12.8  11.5 - 15.5 %   Platelets 179  150 - 400 K/uL  COMPREHENSIVE METABOLIC PANEL     Status: Abnormal   Collection Time    11/03/13  1:04 AM      Result Value Ref Range   Sodium 141  137 - 147 mEq/L   Potassium 3.8  3.7 - 5.3 mEq/L   Chloride 102  96 - 112 mEq/L   CO2 25  19 - 32 mEq/L   Glucose, Bld 118 (*) 70 - 99 mg/dL   BUN 13  6 - 23 mg/dL   Creatinine, Ser 1.20  0.50 - 1.35 mg/dL   Calcium 9.8  8.4 - 10.5 mg/dL   Total Protein 7.7  6.0 -  8.3 g/dL   Albumin 4.3  3.5 - 5.2 g/dL   AST 52 (*) 0 - 37 U/L   ALT 36  0 - 53 U/L   Alkaline Phosphatase 80  39 - 117 U/L   Total Bilirubin 0.8  0.3 - 1.2 mg/dL   GFR calc non Af Amer 68 (*) >90 mL/min   GFR calc Af Amer 79 (*) >90 mL/min   Comment: (NOTE)     The eGFR has been calculated using the CKD EPI equation.     This calculation has not been validated in all clinical situations.     eGFR's persistently <90 mL/min signify possible Chronic Kidney     Disease.   Anion gap 14  5 - 15  ETHANOL     Status: None   Collection Time    11/03/13  1:04 AM      Result Value Ref Range   Alcohol, Ethyl (B) <11  0 - 11 mg/dL   Comment:            LOWEST DETECTABLE LIMIT FOR     SERUM ALCOHOL IS 11 mg/dL     FOR MEDICAL PURPOSES ONLY  SALICYLATE LEVEL     Status: Abnormal   Collection Time    11/03/13  1:04 AM      Result Value Ref Range   Salicylate Lvl <9.1 (*) 2.8 - 20.0 mg/dL  LITHIUM LEVEL     Status: Abnormal   Collection Time    11/03/13  1:06 AM      Result Value Ref Range   Lithium Lvl <0.25 (*) 0.80 - 1.40 mEq/L  URINE RAPID DRUG SCREEN (HOSP PERFORMED)     Status: None   Collection Time    11/03/13  1:29 AM      Result Value Ref Range   Opiates NONE DETECTED  NONE DETECTED   Cocaine NONE DETECTED  NONE DETECTED   Benzodiazepines NONE DETECTED  NONE DETECTED   Amphetamines NONE  DETECTED  NONE DETECTED   Tetrahydrocannabinol NONE DETECTED  NONE DETECTED   Barbiturates NONE DETECTED  NONE DETECTED   Comment:            DRUG SCREEN FOR MEDICAL PURPOSES     ONLY.  IF CONFIRMATION IS NEEDED     FOR ANY PURPOSE, NOTIFY LAB     WITHIN 5 DAYS.                LOWEST DETECTABLE LIMITS     FOR URINE DRUG SCREEN     Drug Class       Cutoff (ng/mL)     Amphetamine      1000     Barbiturate      200     Benzodiazepine   916     Tricyclics       606     Opiates          300     Cocaine          300     THC              50   Labs are reviewed and are pertinent for no medical issues noted.  Current Facility-Administered Medications  Medication Dose Route Frequency Provider Last Rate Last Dose  . acetaminophen (TYLENOL) tablet 650 mg  650 mg Oral Q4H PRN Antonietta Breach, PA-C   650 mg at 11/03/13 1042  . alum & mag hydroxide-simeth (MAALOX/MYLANTA) 200-200-20 MG/5ML suspension 30 mL  30 mL Oral PRN Antonietta Breach, PA-C      . ibuprofen (ADVIL,MOTRIN) tablet 600 mg  600 mg Oral Q8H PRN Antonietta Breach, PA-C   600 mg at 11/03/13 1529  . lithium carbonate (ESKALITH) CR tablet 900 mg  900 mg Oral QHS Lurena Nida, NP   900 mg at 11/03/13 2213  . LORazepam (ATIVAN) tablet 1 mg  1 mg Oral Q8H PRN Antonietta Breach, PA-C   1 mg at 11/04/13 0546  . nicotine (NICODERM CQ - dosed in mg/24 hours) patch 21 mg  21 mg Transdermal Daily Antonietta Breach, PA-C   21 mg at 11/04/13 0935  . ondansetron (ZOFRAN) tablet 4 mg  4 mg Oral Q8H PRN Antonietta Breach, PA-C      . QUEtiapine (SEROQUEL) tablet 300 mg  300 mg Oral QHS Lurena Nida, NP   300 mg at 11/03/13 2213   Current Outpatient Prescriptions  Medication Sig Dispense Refill  . fluPHENAZine decanoate (PROLIXIN) 25 MG/ML injection Inject 12.5 mg into the muscle every 6 (six) weeks.       Marland Kitchen lithium carbonate (ESKALITH) 450 MG CR tablet Take 900 mg by mouth at bedtime.      Marland Kitchen QUEtiapine (SEROQUEL) 300 MG tablet Take 300 mg by mouth at bedtime.         Psychiatric Specialty Exam:     Blood pressure 140/89, pulse 88, temperature 98.1 F (36.7 C), temperature source Oral, resp. rate 19, SpO2 99.00%.There is no weight on file to calculate BMI.  General Appearance: Casual  Eye Contact::  Fair  Speech:  Pressured  Volume:  Normal  Mood:  Irritable  Affect:  Congruent  Thought Process:  Coherent  Orientation:  Full (Time, Place, and Person)  Thought Content:  Delusions  Suicidal Thoughts:  No  Homicidal Thoughts:  No  Memory:  Immediate;   Fair Recent;   Fair Remote;   Fair  Judgement:  Impaired  Insight:  Lacking  Psychomotor Activity:  Increased  Concentration:  Fair  Recall:  [place] of Knowledge:Good  Language: Good  Akathisia:  No  Handed:  Right  AIMS (if indicated):     Assets:  Agricultural consultant Housing Intimacy Leisure Time [place] Talents/Skills Transportation Vocational/Educational  Sleep:      Musculoskeletal: Strength & Muscle Tone: within normal limits Gait & Station: normal Patient leans: N/A  Treatment Plan Summary: Daily contact with patient to assess and evaluate symptoms and progress in treatment Medication management  Waylan Boga, PMH-NP 11/04/2013 2:14 PM

## 2013-11-04 NOTE — ED Notes (Signed)
Pt repeatedly coming to nurses station.  Pt redirected to room.

## 2013-11-04 NOTE — BH Assessment (Signed)
Patient accepted to [facility] by Dr. Jerilee Hoh. Nursing report # [phone]. Patient may come after 4pm. Patient will be transported via [place].

## 2013-11-04 NOTE — Progress Notes (Signed)
  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 Pt denies SI/HI.  Dx schizoaffective disorder bipolar type  PMH schizophrenia    Pt saw ED CM speaking with another pt and inquired of CM when he would be d/c Informed CM "I am ready to leave now"  This pt was seen on 11/03/13 by [facility] staff - Refer to [facility] staff note - Resources provided for self pay providers, services     Action/Plan:   ED CM spoke with pt when he approached her, ED updated ED BH RN, [facility] NP/PA/SW/TTS staff of pt comments about d/c   Action/Plan Detail:   Anticipated DC Date:       Status Recommendation to Physician:   Result of Recommendation:    Other ED [place]  Other  PCP issues  [facility] / [facility] (established/new)    Choice offered to / List presented to:            Status of service:  Completed, signed off  ED Comments:   ED Comments Detail:

## 2013-11-05 LAB — COMPREHENSIVE METABOLIC PANEL
ALK PHOS: 72 U/L
ALT: 56 U/L
Albumin: 3.6 g/dL (ref 3.4–5.0)
Anion Gap: 2 — ABNORMAL LOW (ref 7–16)
BUN: 9 mg/dL (ref 7–18)
Bilirubin,Total: 1.3 mg/dL — ABNORMAL HIGH (ref 0.2–1.0)
CALCIUM: 8.8 mg/dL (ref 8.5–10.1)
Chloride: 107 mmol/L (ref 98–107)
Co2: 30 mmol/L (ref 21–32)
Creatinine: 1.19 mg/dL (ref 0.60–1.30)
EGFR (African American): >60
Glucose: 92 mg/dL (ref 65–99)
OSMOLALITY: 276 (ref 275–301)
POTASSIUM: 4.2 mmol/L (ref 3.5–5.1)
SGOT(AST): 55 U/L — ABNORMAL HIGH (ref 15–37)
Sodium: 139 mmol/L (ref 136–145)
Total Protein: 7.4 g/dL (ref 6.4–8.2)

## 2013-11-05 LAB — LIPID PANEL
CHOLESTEROL: 165 mg/dL (ref 0–200)
HDL Cholesterol: 50 mg/dL (ref 40–60)
Ldl Cholesterol, Calc: 100 mg/dL (ref 0–100)
Triglycerides: 74 mg/dL (ref 0–200)
VLDL CHOLESTEROL, CALC: 15 mg/dL (ref 5–40)

## 2013-11-05 LAB — HEMOGLOBIN A1C: Hemoglobin A1C: 4.7 % (ref 4.2–6.3)

## 2013-11-05 LAB — TSH: THYROID STIMULATING HORM: 0.971 u[IU]/mL

## 2013-11-08 LAB — LITHIUM LEVEL: LITHIUM: 0.36 mmol/L — AB

## 2013-11-08 NOTE — Consult Note (Signed)
Patient seen, evaluated by me. Patient needs inpatient psychiatric treatment for stabilization. Patient seems to be manic

## 2013-11-09 LAB — LITHIUM LEVEL: Lithium: 0.38 mmol/L — ABNORMAL LOW

## 2013-11-14 LAB — BASIC METABOLIC PANEL
Anion Gap: 7 (ref 7–16)
BUN: 10 mg/dL (ref 7–18)
CO2: 28 mmol/L (ref 21–32)
Calcium, Total: 8.7 mg/dL (ref 8.5–10.1)
Chloride: 105 mmol/L (ref 98–107)
Creatinine: 1.23 mg/dL (ref 0.60–1.30)
EGFR (African American): >60
GLUCOSE: 93 mg/dL (ref 65–99)
Osmolality: 278 (ref 275–301)
Potassium: 4.1 mmol/L (ref 3.5–5.1)
Sodium: 140 mmol/L (ref 136–145)

## 2013-11-14 LAB — LITHIUM LEVEL: Lithium: 0.61 mmol/L

## 2014-07-23 ENCOUNTER — Ambulatory Visit (INDEPENDENT_AMBULATORY_CARE_PROVIDER_SITE_OTHER): Payer: Commercial Managed Care - PPO | Admitting: Emergency Medicine

## 2014-07-23 ENCOUNTER — Other Ambulatory Visit (HOSPITAL_BASED_OUTPATIENT_CLINIC_OR_DEPARTMENT_OTHER): Payer: Self-pay

## 2014-07-23 ENCOUNTER — Other Ambulatory Visit: Payer: Self-pay | Admitting: Emergency Medicine

## 2014-07-23 ENCOUNTER — Telehealth: Payer: Self-pay

## 2014-07-23 ENCOUNTER — Ambulatory Visit (HOSPITAL_BASED_OUTPATIENT_CLINIC_OR_DEPARTMENT_OTHER)
Admission: RE | Admit: 2014-07-23 | Discharge: 2014-07-23 | Disposition: A | Discharge status: Home / Self Care | Payer: Commercial Managed Care - PPO | Source: Ambulatory Visit | Attending: Emergency Medicine | Admitting: Emergency Medicine

## 2014-07-23 VITALS — BP 120/78 | HR 71 | Temp 98.4°F | Resp 16 | Ht 75.5 in | Wt 226.4 lb

## 2014-07-23 DIAGNOSIS — M7989 Other specified soft tissue disorders: Secondary | ICD-10-CM | POA: Diagnosis not present

## 2014-07-23 DIAGNOSIS — J029 Acute pharyngitis, unspecified: Secondary | ICD-10-CM

## 2014-07-23 DIAGNOSIS — M79661 Pain in right lower leg: Secondary | ICD-10-CM | POA: Diagnosis present

## 2014-07-23 LAB — POCT RAPID STREP A (OFFICE): Rapid Strep A Screen: NEGATIVE

## 2014-07-23 MED ORDER — FLUTICASONE PROPIONATE 50 MCG/ACT NA SUSP: 2.0000 | Freq: Every day | NASAL | Status: DC

## 2014-07-23 NOTE — Progress Notes (Signed)
   Subjective:    Patient ID: Terry Padilla, male    DOB: [birth year], 54 y.o.   MRN: [id number]   This chart was scribed for Terry Queen, MD by Irene Pap, ED Scribe. This patient was seen in room 8 and patient care was started at 1:51 PM.   HPI HPI Comments: MACINTYRE ALEXA is a 54 y.o. male who presents to the [facility] complaining of right calf swelling onset two days ago. He states that he felt like it started in his knee. He reports a history of intermittent knee issues. He states that he has an itchy throat and runny nose he believes is attributed to allergies. He states that he got back from [place] about two weeks ago, in which he travelled by car. He denies a history of leg clots.   Review of Systems  Constitutional: Negative for fever and chills.  HENT: Positive for rhinorrhea and sore throat.   Cardiovascular: Positive for leg swelling.  Gastrointestinal: Negative for nausea and vomiting.  Skin:       Patient also has a scaly area at the base of the glans of his penis he would like checked.  Hematological: Bruises/bleeds easily.      Objective:  Physical Exam  Constitutional: He appears well-developed and well-nourished. No distress.  HENT:  Head: Normocephalic and atraumatic.  Eyes: Conjunctivae are normal. Right eye exhibits no discharge. Left eye exhibits no discharge.  Neck: Neck supple.  Cardiovascular: Normal rate, regular rhythm and normal heart sounds.  Exam reveals no gallop and no friction rub.   No murmur heard. Pulmonary/Chest: Effort normal and breath sounds normal. No respiratory distress.  Abdominal: Soft. He exhibits no distension. There is no tenderness.  Genitourinary:  There is some scaly dry skin at the base of the glans. There is no evidence of infection no discharge seen.  Musculoskeletal: He exhibits no edema or tenderness.  Rt calf: 40.25 cm measured 15 cm below inferior patella  Left calf: 37 cm measured 15 cm below  inferior patella   Neurological: He is alert.  Skin: Skin is warm and dry.  Psychiatric: He has a normal mood and affect. His behavior is normal. Thought content normal.  Nursing note and vitals reviewed.       Assessment & Plan:  He will use Eucerin cream to the scaly skin at the base of his penis. He does have swelling of the right calf and will proceed with a Doppler study of the right leg. On blood work he brought from [place] [facility] he has an elevated cholesterol and LDL as well as borderline elevated PSA and a low vitamin D. I told him he needed to come in fasting next week and will use would review these findings. He was sent from here to the hospital for his Doppler study.I personally performed the services described in this documentation, which was scribed in my presence. The recorded information has been reviewed and is accurate Doppler study was negative. He will treat his knee and leg discomfort with either Aleve or Advil. He is to follow-up next week to review his laboratory data.Terry Padilla

## 2014-07-23 NOTE — Patient Instructions (Addendum)
Go now for the ultrasound of your leg to look for a blood clot.    Driving directions to [facility] [id number]  - more info    [place], [place] [address]     1. Head south on [address] toward [address]      0.7 mi    2. Turn left onto [address]      0.4 mi    3. Take the 3rd right onto [address]      1.1 mi    4. Take the [address] ramp to [facility]      0.2 mi    5. Merge onto [address]      3.7 mi    6. Take [address] for [address] toward [place]      0.3 mi    7. Keep left at the fork, follow signs for Airport      381 ft    8. Keep left at the fork, follow signs for [facility] [address]      302 ft    9. Turn left onto [address]      2.6 mi    10. Turn right onto [address] will be on the left     0.2 mi     [place]    On the blood work you had your vitamin D was low you PSA was high and your lipid panel was very high. After you finish with your Doppler study please return one day this week so we can review all of these findings. Please take Zyrtec one a day and use Flonase 2 puffs each side of the nose daily.

## 2014-07-23 NOTE — Telephone Encounter (Signed)
Spoke with pt and let him know his doppler was negative and that it was ok to take Aleve for pain/swelling. He will f/u with Dr. Everlene Farrier either Wed or Sat.

## 2014-07-29 NOTE — H&P (Signed)
PATIENT NAME:  Terry Padilla, Terry Padilla MR#:  [id number] DATE OF BIRTH:  1960/12/03  DATE OF ADMISSION:  11/04/2013  DATE OF ASSESSMENT: 11/05/2013   IDENTIFYING INFORMATION AND CHIEF COMPLAINT: A 54 year old man under involuntary commitment, sent here from [facility] for further treatment. The patient's chief complaint: "I'm being held against my will."   HISTORY OF PRESENT ILLNESS: Information obtained from the patient and the chart. According to the commitment paperwork, he was picked up by the police wandering on the road and had been recently driving erratically. That is the commitment petition signed by his wife. The patient tells me that he was trying to check into the [facility] in [place], when suddenly police descended upon him and dragged him into the hospital. He denies that he had been behaving in any sort of erratic or conspicuous manner. Interestingly, he does not deny that he is manic. He reports that starting a couple weeks ago, his mood has been euphoric. He admits that he has been spending excessive amounts of money, although he is a little defensive about it. He claims that he has been sleeping well the last few days. Thoughts have been racing. He seems to have been somewhat grandiose. He tells me that his only usual psychiatric medicine is a Prolixin decanoate shot 25 mg every 6 weeks, which he says was last given a couple weeks ago, but then was given when he was in the hospital at [facility]. He denies that he is currently prescribed any other psychiatric medicine. The commitment petition alleges that he is supposed to be on lithium and Seroquel. The patient denies that he has been abusing any drugs or alcohol. As best I can put together the story from what the patient is saying, it sounds like he has been on an escalating mania for a few weeks with increasingly erratic behavior. He may or may not have been compliant with recommended medication, and finally he was petitioned by  his wife for hospitalization. The patient denies any suicidal or homicidal ideation. He does report believing that he is a prophet and being religiously grandiose.   PAST PSYCHIATRIC HISTORY: Says that he was first diagnosed and treated for mental illness around 42, at which time he was living in [place]. He says he was initially diagnosed with schizophrenia. Since then, he has had long periods of time of being essentially asymptomatic, but he has also had several flare-ups. The most recent hospitalization was in 2013 at [facility]. He remembers multiple medications that he has been treated with in the past, including antipsychotics and mood stabilizers. He says that because he does so well in between episodes, he often gets taken off of his other medicines and that he had been under the impression that he was only supposed to be taking Prolixin decanoate. He denies any history of suicide attempts or violence.   SUBSTANCE ABUSE HISTORY: Denies any alcohol or drug abuse and denies any past history of alcohol or drug abuse.   SOCIAL HISTORY: He lives with his wife and 2 young children in [place]. 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 He says that he travels a great deal for his business. His wife is a Marine scientist. He has been living in [place] for about 7 years. Originally from [place]. Does have some extended family in the [place] but not locally.   PAST MEDICAL HISTORY: He denies any medical problems. Denies any high  blood pressure, diabetes, heart disease or other chronic medical illness.   CURRENT MEDICATIONS: The patient reports all he is taking is a Prolixin decanoate plus vitamin B3 1000 units a day.   ALLERGIES: No known drug allergies.   REVIEW OF SYSTEMS: Denies any pain. Denies shortness of breath. Denies cardiac, GI or pulmonary symptoms. No malaise. No fever or chills. Feeling a little bit irritable, but denies hallucinations.  Denies feeling like he is paranoid. Generally negative review of systems.   MENTAL STATUS EXAMINATION: Neatly groomed gentleman who looks his stated age. He was a little bit intrusive, but not inappropriately so, and was cooperative with the interview. Eye contact was good. Psychomotor activity was only slightly animated. Speech was slightly pressured, but not to a really remarkable degree, and was not loud or hostile. Easy to understand despite his accent. His thoughts are a little bit scattered. His story is somewhat hard to follow, but he did not make any obviously bizarre statements. I do not have collateral information to confirm whether or not his description of his business might be grandiose. He does have some religious preoccupations and says that he is a prophet who can see into the future. Denied suicidal or homicidal ideation. Denies hallucinations. Judgment and insight is partial. He understands that he is manic, but he really cannot see the whole recent history for how pathological it has been. His short- and long-term memory appear to be grossly intact. His fund of knowledge is normal. Intelligence is normal. Alert and oriented x4.   PHYSICAL EXAMINATION:  GENERAL: Healthy-appearing gentleman who looks his stated age.  SKIN: No skin lesions identified.  HEENT: Pupils equal and reactive. Face is symmetric. Oral mucosa normal.  NECK AND BACK: Nontender to palpation.  EXTREMITIES: Full range of motion at all extremities. Normal gait.  NEUROLOGIC: Strength and reflexes symmetric and normal throughout. Cranial nerves symmetric and normal.  LUNGS: Clear with no wheezes.  HEART: Regular rate and rhythm.  ABDOMEN: Soft, nontender, normal bowel sounds.  VITAL SIGNS: Temperature 98.9, pulse 96, respirations 18, blood pressure 116/79.   LABORATORY RESULTS: Slightly elevated AST at 57, elevated bilirubin 1.3. He says he knows of no history of liver disease. TSH is normal. Cholesterol and  triglycerides are all normal. Hemoglobin A1c low-normal at 4.7. No other labs on admission.   ASSESSMENT: This is a 54 year old gentleman who gives a history most consistent with bipolar disorder with a recent manic episode with psychosis. He has been at [facility] for several days prior to transfer. Currently, he only looks somewhat hypomanic, and my guess is that he has gotten better since being over at the hospital. I suggested to him that adding lithium seemed like the best thing to do under these circumstances, and he agreed, so I will start lithium 900 mg at night. I tried to call his wife, but I got a message that the mailbox was not set up, so I could not leave a message for her. Hopefully, we will meet her and get some collateral information. The patient was reassured that we are working on trying to get him well and get him discharged as soon as reasonably possible. Include him in groups and activities. The patient was somewhat religiously preoccupied, and I asked him if he wanted to speak to the chaplain or have any other religious assistance, and he declined it.   DIAGNOSIS, PRINCIPAL AND PRIMARY:  AXIS I: Bipolar disorder type I, manic.   SECONDARY DIAGNOSES:  AXIS I:  No further.  AXIS II: No diagnosis.  AXIS III: No diagnosis.  AXIS IV: Moderate to severe from his acute illness.  AXIS V: Functioning at time of evaluation is 93.     ____________________________ Gonzella Lex, MD jtc:lb D: 11/05/2013 11:55:04 ET T: 11/05/2013 12:04:12 ET JOB#: [id number]  cc: Gonzella Lex, MD, <Dictator> Gonzella Lex MD ELECTRONICALLY SIGNED 12/16/2013 16:54

## 2014-07-29 NOTE — Discharge Summary (Signed)
PATIENT NAME:  Terry Padilla, Terry Padilla MR#:  [id number] DATE OF BIRTH:  [birth year]  DATE OF ADMISSION:  11/04/2013 DATE OF DISCHARGE:  11/14/2013  REASON FOR ADMISSION: Manic episode.  DISCHARGE DIAGNOSES:  AXIS I: Bipolar disorder type 1, current episode manic, severe.  AXIS II: Deferred.  AXIS III: None.  AXIS IV: Financial stressors, marital stressors.  AXIS V: Global assessment of functioning of 50.  DISCHARGE MEDICATIONS: Lithium carbonate SA (Lithobid) 600 mg p.o. b.i.d., Fluphenazine 5 mg p.o. b.i.d., Prolixin Fluphenazine Decanoate 12.5 mg IM q. 6 weeks, last injection given at the office of his outpatient psychiatrist at the end of July. Diphenhydramine Benadryl 50 mg p.o. q. bedtime, lorazepam, Ativan 2 mg p.o. q. bedtime for insomnia.  HOSPITAL COURSE: Terry Padilla was transferred to our facility from [facility]. The patient was initially petitioned by his wife for psychiatric evaluation as he was having manic symptoms. The patient had been spending large amounts of money in hotel rooms and was driving erratically. He was not sleeping. The patient as well was voicing grandiose delusions saying that he makes millions and that he had some special powers. The patient has a known history of bipolar disorder. He follows up with an outpatient psychiatrist in [facility]. The patient has been maintained on Fluphenazine Decanoate 12.5 mg q. 6 weeks for years and the patient has been compliant with all his injections, last one being at the end of July. The patient was started on lithium carbonate 900 mg total daily dose. About 4 days later after the lithium was started, a lithium level was checked and it was found to be subtherapeutic, around 0.3. For this reason, the lithium was increased to 600 mg twice a day. The patient in addition was started on Fluphenazine oral 5 mg p.o. b.i.d., Benadryl 50 mg to prevent EPS and Ativan 2 mg p.o. q. bedtime for insomnia. The patient was compliant  with all his medications throughout his stay and tolerated the regimen well with no side effects. During his stay, the patient was found to have decreased need for sleep. For the first 3 nights of his admission, the patient did not have any sleep. The patient was pacing, was sexually inappropriate with male staff and peers asking them to go in the room and trying to kiss one of the patients. The patient did not display any aggressive behavior during this stay. There was no need for seclusion or restraints or emergency force medications. At the time of the discharge, the patient was much improved. The patient was sleeping an average of 5 to 6 hours in the evenings. He was not having any EPS or tremors from his medications or any other side effects. The patient's grandiosity resolved. He was no longer seen pacing around the unit and his behavior became more appropriate.  MENTAL STATUS EXAMINATION: At the time of the discharge: The patient was alert, oriented to person, place, time, and situation. Psychomotor activity within normal range, thought content within normal range, speech had a regular tone. Thought process was linear and goal directed. Thought content is negative for suicidality, homicidality. Perception is negative for psychosis. Mood euthymic. Affect: reactive. Insight and judgement good.  LABORATORY RESULTS: Lithium levels at discharge 0.61, BUN 10, creatinine 1.23, sodium 140, potassium 4.1, GFR greater than 60, AST 55, ALT 56, TSH 0.97.  DISCHARGE DISPOSITION: The patient will be discharged back to his home with his family in [place], [place].  DISCHARGE FOLLOWUP: The patient will continue to  follow up at [facility] with his outpatient psychiatrist.     ____________________________ Hildred Priest, MD ahg:lt D: 11/14/2013 14:28:17 ET T: 11/14/2013 17:34:46 ET JOB#: [id number]  cc: Hildred Priest, MD, <Dictator> Rhodia Albright  MD ELECTRONICALLY SIGNED 11/15/2013 16:23

## 2014-08-04 ENCOUNTER — Ambulatory Visit (INDEPENDENT_AMBULATORY_CARE_PROVIDER_SITE_OTHER): Payer: Commercial Managed Care - PPO | Admitting: Emergency Medicine

## 2014-08-04 VITALS — BP 138/84 | HR 85 | Temp 98.6°F | Resp 18 | Ht 75.5 in | Wt 231.2 lb

## 2014-08-04 DIAGNOSIS — E785 Hyperlipidemia, unspecified: Secondary | ICD-10-CM | POA: Diagnosis not present

## 2014-08-04 DIAGNOSIS — E559 Vitamin D deficiency, unspecified: Secondary | ICD-10-CM | POA: Diagnosis not present

## 2014-08-04 DIAGNOSIS — R972 Elevated prostate specific antigen [PSA]: Secondary | ICD-10-CM | POA: Diagnosis not present

## 2014-08-04 DIAGNOSIS — Z7189 Other specified counseling: Secondary | ICD-10-CM

## 2014-08-04 DIAGNOSIS — F209 Schizophrenia, unspecified: Secondary | ICD-10-CM | POA: Diagnosis not present

## 2014-08-04 DIAGNOSIS — IMO0001 Reserved for inherently not codable concepts without codable children: Secondary | ICD-10-CM

## 2014-08-04 DIAGNOSIS — R030 Elevated blood-pressure reading, without diagnosis of hypertension: Secondary | ICD-10-CM | POA: Diagnosis not present

## 2014-08-04 DIAGNOSIS — Z7184 Encounter for health counseling related to travel: Secondary | ICD-10-CM

## 2014-08-04 MED ORDER — TYPHOID VACCINE PO CPDR: 1.0000 | DELAYED_RELEASE_CAPSULE | ORAL | Status: DC

## 2014-08-04 MED ORDER — DOXYCYCLINE HYCLATE 100 MG PO TABS: ORAL_TABLET | ORAL | Status: DC

## 2014-08-04 NOTE — Progress Notes (Signed)
Subjective:  This chart was scribed for [name], MD by Donato Schultz, Medical Scribe. This patient was seen in Room 14 and the patient's care was started at 11:11 AM.   Patient ID: Terry Padilla, male    DOB: June 22, 1960, 54 y.o.   MRN: [id number]  HPI HPI Comments: Terry Padilla is a 54 y.o. male who presents to the [facility] for a follow-up visit regarding calf swelling.  He states that after his visit, he noticed the swelling spread throughout his entire right leg.  He took Aleve for his symptoms and experienced relief to his leg swelling over the course of three days.  He states that he will be traveling to [place] on May 11.  He would also like to receive medication for Malaria to take during his trip.       Past Medical History  Diagnosis Date  . Schizophrenia    Past Surgical History  Procedure Laterality Date  . Tonsillectomy     History reviewed. No pertinent family history. History   Social History  . Marital Status: Married    Spouse Name: N/A  . Number of Children: N/A  . Years of Education: N/A   Occupational History  . Not on file.   Social History Main Topics  . Smoking status: Never Smoker   . Smokeless tobacco: Never Used  . Alcohol Use: No  . Drug Use: No  . Sexual Activity: Not on file   Other Topics Concern  . Not on file   Social History Narrative  . No narrative on file   No Known Allergies  Review of Systems  Cardiovascular: Negative for leg swelling.    Objective:  Physical Exam  Constitutional: He is oriented to person, place, and time. He appears well-developed and well-nourished.  HENT:  Head: Normocephalic and atraumatic.  Eyes: EOM are normal.  Neck: Normal range of motion.  Cardiovascular: Normal rate, regular rhythm and normal heart sounds.  Exam reveals no gallop and no friction rub.   No murmur heard. Pulmonary/Chest: Effort normal.  Musculoskeletal: Normal range of motion.  Calf has no  tenderness or swelling.  Negative Homans sign.  Neurological: He is alert and oriented to person, place, and time.  Skin: Skin is warm and dry.  Psychiatric: He has a normal mood and affect. His behavior is normal.  Nursing note and vitals reviewed.    BP 138/84 mmHg  Pulse 85  Temp(Src) 98.6 F (37 C) (Oral)  Resp 18  Ht 6' 3.5" (1.918 m)  Wt 231 lb 3.2 oz (104.872 kg)  BMI 28.51 kg/m2  SpO2 97% Assessment & Plan:   1. Elevated BP Blood pressure is at goal  2. Hyperlipidemia Patient will return to clinic when he returns from his trip for fasting study.  3. PSA elevation He has seen urology before and this is 7 or 8 years ago he will return to clinic when he returns from his trip for repeat PSA  4. Vitamin D deficiency This will be repeated on return  5. Schizophrenia, unspecified type Stable on current medications  6. Travel advice encounter  - doxycycline (VIBRA-TABS) 100 MG tablet; One tablet day of trip during your trip and for one month upon return  Dispense: 60 tablet; Refill: 0 - typhoid (VIVOTIF BERNA VACCINE) DR capsule; Take 1 capsule by mouth every other day.  Dispense: 4 capsule; Refill: 0 He has had hepatitis A vaccinations documented. He also received yellow fever  in the past.  I personally performed the services described in this documentation, which was scribed in my presence. The recorded information has been reviewed and is accurate.  [name], MD  [facility], [facility]  08/04/2014 5:20 PM

## 2014-08-04 NOTE — Progress Notes (Addendum)
Subjective:  This chart was scribed for [name], MD by Donato Schultz, Medical Scribe. This patient was seen in Room 14 and the patient's care was started at 11:11 AM.   Patient ID: Terry Padilla, male    DOB: 1960-09-18, 55 y.o.   MRN: [id number]  HPI HPI Comments: Terry Padilla is a 54 y.o. male who presents to the [facility] for a follow-up visit regarding calf swelling.  He states that after his visit, he noticed the swelling spread throughout his entire right leg.  He took Aleve for his symptoms and experienced relief to his leg swelling over the course of three days.  He states that he will be traveling to [place] on May 11.  He would also like to receive medication for Malaria to take during his trip.       Past Medical History  Diagnosis Date   Schizophrenia    Past Surgical History  Procedure Laterality Date   Tonsillectomy     History reviewed. No pertinent family history. History   Social History   Marital Status: Married    Spouse Name: N/A   Number of Children: N/A   Years of Education: N/A   Occupational History   Not on file.   Social History Main Topics   Smoking status: Never Smoker    Smokeless tobacco: Never Used   Alcohol Use: No   Drug Use: No   Sexual Activity: Not on file   Other Topics Concern   Not on file   Social History Narrative   No Known Allergies  Review of Systems  Cardiovascular: Negative for leg swelling.    Objective:  Physical Exam  Constitutional: He is oriented to person, place, and time. He appears well-developed and well-nourished.  HENT:  Head: Normocephalic and atraumatic.  Eyes: EOM are normal.  Neck: Normal range of motion.  Cardiovascular: Normal rate, regular rhythm and normal heart sounds.  Exam reveals no gallop and no friction rub.   No murmur heard. Pulmonary/Chest: Effort normal.  Musculoskeletal: Normal range of motion.  Calf has no tenderness or swelling.  Negative  Homans sign.  Neurological: He is alert and oriented to person, place, and time.  Skin: Skin is warm and dry.  Psychiatric: He has a normal mood and affect. His behavior is normal.  Nursing note and vitals reviewed.    BP 138/84 mmHg   Pulse 85   Temp(Src) 98.6 F (37 C) (Oral)   Resp 18   Ht 6' 3.5" (1.918 m)   Wt 231 lb 3.2 oz (104.872 kg)   BMI 28.51 kg/m2   SpO2 97% Assessment & Plan:   1. Elevated BP Blood pressure is at goal  2. Hyperlipidemia Patient will return to clinic when he returns from his trip for fasting study.  3. PSA elevation He has seen urology before and this is 7 or 8 years ago he will return to clinic when he returns from his trip for repeat PSA  4. Vitamin D deficiency This will be repeated on return  5. Schizophrenia, unspecified type Stable on current medications  6. Travel advice encounter  - doxycycline (VIBRA-TABS) 100 MG tablet; One tablet day of trip during your trip and for one month upon return  Dispense: 60 tablet; Refill: 0 - typhoid (VIVOTIF BERNA VACCINE) DR capsule; Take 1 capsule by mouth every other day.  Dispense: 4 capsule; Refill: 0 He has had hepatitis A vaccinations documented. He also received yellow  fever in the past.  I personally performed the services described in this documentation, which was scribed in my presence. The recorded information has been reviewed and is accurate.  [name], MD  [facility]  08/04/2014 12:10 PM

## 2015-01-25 ENCOUNTER — Ambulatory Visit (INDEPENDENT_AMBULATORY_CARE_PROVIDER_SITE_OTHER): Payer: Commercial Managed Care - PPO | Admitting: Family Medicine

## 2015-01-25 VITALS — BP 132/84 | HR 74 | Temp 99.0°F | Resp 18 | Ht 76.0 in | Wt 231.0 lb

## 2015-01-25 DIAGNOSIS — Z23 Encounter for immunization: Secondary | ICD-10-CM

## 2015-01-25 DIAGNOSIS — Q330 Congenital cystic lung: Secondary | ICD-10-CM | POA: Diagnosis not present

## 2015-01-25 DIAGNOSIS — Z1211 Encounter for screening for malignant neoplasm of colon: Secondary | ICD-10-CM | POA: Diagnosis not present

## 2015-01-25 DIAGNOSIS — F3112 Bipolar disorder, current episode manic without psychotic features, moderate: Secondary | ICD-10-CM

## 2015-01-25 DIAGNOSIS — Z0000 Encounter for general adult medical examination without abnormal findings: Secondary | ICD-10-CM | POA: Diagnosis not present

## 2015-01-25 LAB — COMPREHENSIVE METABOLIC PANEL
ALK PHOS: 55 U/L (ref 40–115)
ALT: 22 U/L (ref 9–46)
AST: 19 U/L (ref 10–35)
Albumin: 4.3 g/dL (ref 3.6–5.1)
BILIRUBIN TOTAL: 1.5 mg/dL — AB (ref 0.2–1.2)
BUN: 8 mg/dL (ref 7–25)
CO2: 28 mmol/L (ref 20–31)
CREATININE: 1.03 mg/dL (ref 0.70–1.33)
Calcium: 9.4 mg/dL (ref 8.6–10.3)
Chloride: 102 mmol/L (ref 98–110)
Glucose, Bld: 79 mg/dL (ref 65–99)
Potassium: 4.2 mmol/L (ref 3.5–5.3)
SODIUM: 140 mmol/L (ref 135–146)
TOTAL PROTEIN: 7.5 g/dL (ref 6.1–8.1)

## 2015-01-25 LAB — CBC
HCT: 44.1 % (ref 39.0–52.0)
Hemoglobin: 14.8 g/dL (ref 13.0–17.0)
MCH: 30.5 pg (ref 26.0–34.0)
MCHC: 33.6 g/dL (ref 30.0–36.0)
MCV: 90.9 fL (ref 78.0–100.0)
MPV: 10.4 fL (ref 8.6–12.4)
PLATELETS: 229 10*3/uL (ref 150–400)
RBC: 4.85 MIL/uL (ref 4.22–5.81)
RDW: 12.4 % (ref 11.5–15.5)
WBC: 6.6 10*3/uL (ref 4.0–10.5)

## 2015-01-25 LAB — LIPID PANEL
Cholesterol: 253 mg/dL — ABNORMAL HIGH (ref 125–200)
HDL: 44 mg/dL (ref >=40)
LDL Cholesterol: 184 mg/dL — ABNORMAL HIGH (ref <130)
Total CHOL/HDL Ratio: 5.8 Ratio — ABNORMAL HIGH (ref <=5.0)
Triglycerides: 127 mg/dL (ref <150)
VLDL: 25 mg/dL (ref <30)

## 2015-01-25 NOTE — Progress Notes (Signed)
History: 54 year old man here for an annual physical examination. It is not a prior exam for him, but it was due. He has no major acute medical complaints.  Past medical history: Operations: Tonsillectomy Major medical illnesses: None Psychiatric illness: Bipolar 1 with apparent psychotic component Medications: Prolixin injections every 3 weeks; lithium Allergies: None Generally has been very good health. 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 However the Lord healed him and he has not had any symptoms since then, never having had surgery.  Family history: Mother is living, in her upper 38s, has hypertension. Father died at an old age of 37. No major family health issues except for several siblings with high cholesterol.  Social history: Does not smoke, drink, or use drugs. He is married. He does work truck Press photographer. He is a [employer].  Review of systems: Constitutional: Unremarkable HEENT: Unremarkable Eyes: Wears glasses Respiratory: Unremarkable Cardiovascular: Unremarkable GI: Unremarkable Has never had a colonoscopy Endocrine: Unremarkable Genitourinary: Has a long history of erectile dysfunction, has been evaluated and tried medications without relief, but he and his wife become content with this. Musculoskeletal: Unremarkable Dermatologic: Unremarkable Allergy/immunology: Unremarkable Neurologic: Unremarkable Hematologic: Unremarkable Psychiatric: Stable, issues as above  Physical exam: Pleasant, alert, oriented [place] male in no acute distress. His TMs are normal. Eyes PERRLA. Fundi benign. Throat clear. Neck supple without nodes thyromegaly. No carotid bruits. Chest is clear to all station. Heart regular without murmurs gallops or arrhythmias. No axillary or inguinal nodes. Abdomen soft without masses or tenderness. Normal male external  genitalia, no scrotal masses, testes a little atrophic. Extremities otherwise unremarkable. Skin unremarkable. Prostate normal in size and contour.  Assessment: Normal annual physical exam History of bipolar disorder History of erectile impotence Needs flu shot (patient finally agreed to go ahead and giving it though he does not like getting them) new  Return as needed

## 2015-01-25 NOTE — Patient Instructions (Addendum)
No new medication today  Influenza vaccine will be given  We will let you know the results of your labs  If you aren't continued to do well plan to return for an annual physical. Always feel free to come in in between times if problems arise.  Someone will contact you with regard to scheduling a screening colonoscopy this fall

## 2015-01-26 LAB — TSH: TSH: 1.184 u[IU]/mL (ref 0.350–4.500)

## 2015-01-26 LAB — LITHIUM LEVEL: Lithium Lvl: 0.5 mEq/L — ABNORMAL LOW (ref 0.80–1.40)

## 2015-01-30 ENCOUNTER — Encounter: Payer: Self-pay | Admitting: Physician Assistant

## 2015-11-28 ENCOUNTER — Ambulatory Visit (INDEPENDENT_AMBULATORY_CARE_PROVIDER_SITE_OTHER): Payer: Commercial Managed Care - PPO | Admitting: Emergency Medicine

## 2015-11-28 VITALS — BP 146/96 | HR 76 | Temp 98.6°F | Resp 17 | Ht 76.5 in | Wt 232.0 lb

## 2015-11-28 DIAGNOSIS — Z1159 Encounter for screening for other viral diseases: Secondary | ICD-10-CM

## 2015-11-28 DIAGNOSIS — IMO0001 Reserved for inherently not codable concepts without codable children: Secondary | ICD-10-CM

## 2015-11-28 DIAGNOSIS — D2239 Melanocytic nevi of other parts of face: Secondary | ICD-10-CM

## 2015-11-28 DIAGNOSIS — F3112 Bipolar disorder, current episode manic without psychotic features, moderate: Secondary | ICD-10-CM | POA: Diagnosis not present

## 2015-11-28 DIAGNOSIS — R030 Elevated blood-pressure reading, without diagnosis of hypertension: Secondary | ICD-10-CM

## 2015-11-28 DIAGNOSIS — R972 Elevated prostate specific antigen [PSA]: Secondary | ICD-10-CM | POA: Diagnosis not present

## 2015-11-28 LAB — COMPREHENSIVE METABOLIC PANEL
ALT: 17 U/L (ref 9–46)
AST: 17 U/L (ref 10–35)
Albumin: 4.4 g/dL (ref 3.6–5.1)
Alkaline Phosphatase: 57 U/L (ref 40–115)
BUN: 9 mg/dL (ref 7–25)
CHLORIDE: 106 mmol/L (ref 98–110)
CO2: 27 mmol/L (ref 20–31)
CREATININE: 1.33 mg/dL (ref 0.70–1.33)
Calcium: 9.6 mg/dL (ref 8.6–10.3)
GLUCOSE: 83 mg/dL (ref 65–99)
POTASSIUM: 4 mmol/L (ref 3.5–5.3)
SODIUM: 142 mmol/L (ref 135–146)
Total Bilirubin: 0.8 mg/dL (ref 0.2–1.2)
Total Protein: 7.3 g/dL (ref 6.1–8.1)

## 2015-11-28 LAB — POCT CBC
GRANULOCYTE PERCENT: 67.4 % (ref 37–80)
HCT, POC: 43.6 % (ref 43.5–53.7)
Hemoglobin: 14.9 g/dL (ref 14.1–18.1)
Lymph, poc: 1.9 (ref 0.6–3.4)
MCH, POC: 30.9 pg (ref 27–31.2)
MCHC: 34.2 g/dL (ref 31.8–35.4)
MCV: 90.3 fL (ref 80–97)
MID (CBC): 0.5 (ref 0–0.9)
MPV: 8.2 fL (ref 0–99.8)
PLATELET COUNT, POC: 192 10*3/uL (ref 142–424)
POC Granulocyte: 4.9 (ref 2–6.9)
POC LYMPH %: 25.8 % (ref 10–50)
POC MID %: 6.8 %M (ref 0–12)
RBC: 4.83 M/uL (ref 4.69–6.13)
RDW, POC: 12.1 %
WBC: 7.3 10*3/uL (ref 4.6–10.2)

## 2015-11-28 LAB — BASIC METABOLIC PANEL
BUN: 9 mg/dL (ref 7–25)
CALCIUM: 9.6 mg/dL (ref 8.6–10.3)
CO2: 27 mmol/L (ref 20–31)
Chloride: 106 mmol/L (ref 98–110)
Creat: 1.33 mg/dL (ref 0.70–1.33)
GLUCOSE: 83 mg/dL (ref 65–99)
Potassium: 4 mmol/L (ref 3.5–5.3)
Sodium: 142 mmol/L (ref 135–146)

## 2015-11-28 LAB — PSA: PSA: 3.8 ng/mL (ref <=4.0)

## 2015-11-28 MED ORDER — AMLODIPINE BESYLATE 5 MG PO TABS: 5.0000 mg | ORAL_TABLET | Freq: Every day | ORAL | 3 refills | Status: DC

## 2015-11-28 NOTE — Patient Instructions (Addendum)
   IF you received an x-ray today, you will receive an invoice from [facility]. Please contact [facility] at [phone] with questions or concerns regarding your invoice.   IF you received labwork today, you will receive an invoice from [facility]. Please contact [facility] at [phone] with questions or concerns regarding your invoice.   Our billing staff will not be able to assist you with questions regarding bills from these companies.  You will be contacted with the lab results as soon as they are available. The fastest way to get your results is to activate your My Chart account. Instructions are located on the last page of this paperwork. If you have not heard from us regarding the results in 2 weeks, please contact this office.     DASH Eating Plan DASH stands for "Dietary Approaches to Stop Hypertension." The DASH eating plan is a healthy eating plan that has been shown to reduce high blood pressure (hypertension). Additional health benefits may include reducing the risk of type 2 diabetes mellitus, heart disease, and stroke. The DASH eating plan may also help with weight loss. WHAT DO I NEED TO KNOW ABOUT THE DASH EATING PLAN? For the DASH eating plan, you will follow these general guidelines:  Choose foods with a percent daily value for sodium of less than 5% (as listed on the food label).  Use salt-free seasonings or herbs instead of table salt or sea salt.  Check with your health care provider or pharmacist before using salt substitutes.  Eat lower-sodium products, often labeled as "lower sodium" or "no salt added."  Eat fresh foods.  Eat more vegetables, fruits, and low-fat dairy products.  Choose whole grains. Look for the word "whole" as the first word in the ingredient list.  Choose fish and skinless chicken or turkey more often than red meat. Limit fish, poultry, and meat to 6 oz (170 g) each day.  Limit sweets,  desserts, sugars, and sugary drinks.  Choose heart-healthy fats.  Limit cheese to 1 oz (28 g) per day.  Eat more home-cooked food and less restaurant, buffet, and fast food.  Limit fried foods.  Cook foods using methods other than frying.  Limit canned vegetables. If you do use them, rinse them well to decrease the sodium.  When eating at a restaurant, ask that your food be prepared with less salt, or no salt if possible. WHAT FOODS CAN I EAT? Seek help from a dietitian for individual calorie needs. Grains Whole grain or whole wheat bread. Brown rice. Whole grain or whole wheat pasta. Quinoa, bulgur, and whole grain cereals. Low-sodium cereals. Corn or whole wheat flour tortillas. Whole grain cornbread. Whole grain crackers. Low-sodium crackers. Vegetables Fresh or frozen vegetables (raw, steamed, roasted, or grilled). Low-sodium or reduced-sodium tomato and vegetable juices. Low-sodium or reduced-sodium tomato sauce and paste. Low-sodium or reduced-sodium canned vegetables.  Fruits All fresh, canned (in natural juice), or frozen fruits. Meat and Other Protein Products Ground beef (85% or leaner), grass-fed beef, or beef trimmed of fat. Skinless chicken or turkey. Ground chicken or turkey. Pork trimmed of fat. All fish and seafood. Eggs. Dried beans, peas, or lentils. Unsalted nuts and seeds. Unsalted canned beans. Dairy Low-fat dairy products, such as skim or 1% milk, 2% or reduced-fat cheeses, low-fat ricotta or cottage cheese, or plain low-fat yogurt. Low-sodium or reduced-sodium cheeses. Fats and Oils Tub margarines without trans fats. Light or reduced-fat mayonnaise and salad dressings (reduced sodium). Avocado. Safflower, olive, or canola   oils. Natural peanut or almond butter. Other Unsalted popcorn and pretzels. The items listed above may not be a complete list of recommended foods or beverages. Contact your dietitian for more options. WHAT FOODS ARE NOT  RECOMMENDED? Grains White bread. White pasta. White rice. Refined cornbread. Bagels and croissants. Crackers that contain trans fat. Vegetables Creamed or fried vegetables. Vegetables in a cheese sauce. Regular canned vegetables. Regular canned tomato sauce and paste. Regular tomato and vegetable juices. Fruits Dried fruits. Canned fruit in light or heavy syrup. Fruit juice. Meat and Other Protein Products Fatty cuts of meat. Ribs, chicken wings, bacon, sausage, bologna, salami, chitterlings, fatback, hot dogs, bratwurst, and packaged luncheon meats. Salted nuts and seeds. Canned beans with salt. Dairy Whole or 2% milk, cream, half-and-half, and cream cheese. Whole-fat or sweetened yogurt. Full-fat cheeses or blue cheese. Nondairy creamers and whipped toppings. Processed cheese, cheese spreads, or cheese curds. Condiments Onion and garlic salt, seasoned salt, table salt, and sea salt. Canned and packaged gravies. Worcestershire sauce. Tartar sauce. Barbecue sauce. Teriyaki sauce. Soy sauce, including reduced sodium. Steak sauce. Fish sauce. Oyster sauce. Cocktail sauce. Horseradish. Ketchup and mustard. Meat flavorings and tenderizers. Bouillon cubes. Hot sauce. Tabasco sauce. Marinades. Taco seasonings. Relishes. Fats and Oils Butter, stick margarine, lard, shortening, ghee, and bacon fat. Coconut, palm kernel, or palm oils. Regular salad dressings. Other Pickles and olives. Salted popcorn and pretzels. The items listed above may not be a complete list of foods and beverages to avoid. Contact your dietitian for more information. WHERE CAN I FIND MORE INFORMATION? National Heart, Lung, and Blood Institute: www.nhlbi.nih.gov/health/health-topics/topics/dash/   This information is not intended to replace advice given to you by your health care provider. Make sure you discuss any questions you have with your health care provider.   Document Released: 03/13/2011 Document Revised: 04/14/2014  Document Reviewed: 01/26/2013 Elsevier Interactive Patient Education 2016 Elsevier Inc.  

## 2015-11-28 NOTE — Progress Notes (Signed)
By signing my name below, I, [name], attest that this documentation has been prepared under the direction and in the presence of [name], MD. Electronically Signed: [name], [place]. 11/28/2015 , 11:59 AM .  Patient was seen in room 5 .  Chief Complaint:  Chief Complaint  Patient presents with  . Hypertension  . Other    moles on face     HPI: Terry Padilla is a 55 y.o. male who reports to [facility] today for follow up on elevated BP. He was at behavioral health receiving his injection, and measured BP at 165/101. He's currently not on BP medication. He's been trying to improve with exercise and change in diet. He believes both his parents had history of elevated BP. He denies leg swelling, shortness of breath or chest pain. He had an annual physical done by Dr. Linna Darner back in Oct 2016.   He has a history of elevated PSA with biopsy done previously. His PSA has been as high as 6, with most recent under 4 about 1.5 years ago.   He also mentions having a few bumps over the top of his scalp and right side of his nose.   Past Medical History:  Diagnosis Date  . Schizophrenia [facility])    Past Surgical History:  Procedure Laterality Date  . TONSILLECTOMY     Social History   Social History  . Marital status: Married    Spouse name: N/A  . Number of children: N/A  . Years of education: N/A   Social History Main Topics  . Smoking status: Never Smoker  . Smokeless tobacco: Never Used  . Alcohol use No  . Drug use: No  . Sexual activity: Not on file   Other Topics Concern  . Not on file   Social History Narrative  . No narrative on file   No family history on file. No Known Allergies Prior to Admission medications   Medication Sig Start Date End Date Taking? Authorizing Provider  fluPHENAZine decanoate (PROLIXIN) 25 MG/ML injection Inject 12.5 mg into the muscle every 21 ( twenty-one) days.    Yes Historical Provider, MD  lithium carbonate 300 MG capsule  Take 600 mg by mouth 2 (two) times daily with a meal.   Yes Historical Provider, MD  doxycycline (VIBRA-TABS) 100 MG tablet One tablet day of trip during your trip and for one month upon return Patient not taking: Reported on 01/25/2015 08/04/14   Darlyne Russian, MD  Flaxseed, Linseed, (FLAX SEED OIL) 1000 MG CAPS Take by mouth.    Historical Provider, MD  fluticasone (FLONASE) 50 MCG/ACT nasal spray Place 2 sprays into both nostrils daily. Patient not taking: Reported on 08/04/2014 07/23/14   Darlyne Russian, MD     ROS:  Constitutional: negative for fever, chills, night sweats, weight changes, or fatigue  HEENT: negative for vision changes, hearing loss, congestion, rhinorrhea, ST, epistaxis, or sinus pressure Cardiovascular: negative for chest pain or palpitations Respiratory: negative for hemoptysis, wheezing, shortness of breath, or cough Abdominal: negative for abdominal pain, nausea, vomiting, diarrhea, or constipation Dermatological: negative for rash Neurologic: negative for headache, dizziness, or syncope All other systems reviewed and are otherwise negative with the exception to those above and in the HPI.  PHYSICAL EXAM: Vitals:   11/28/15 1145  BP: (!) 146/96  Pulse: 76  Resp: 17  Temp: 98.6 F (37 C)   Body mass index is 27.87 kg/m.   General: Alert, no acute distress  HEENT:  Normocephalic, atraumatic, oropharynx patent, soft fleshy mole right side of nose Eye: EOMI, PEERLDC Cardiovascular:  Regular rate and rhythm, no rubs murmurs or gallops.  No Carotid bruits, radial pulse intact. No pedal edema.  Respiratory: Clear to auscultation bilaterally.  No wheezes, rales, or rhonchi.  No cyanosis, no use of accessory musculature Abdominal: 1x2 cm umbilical hernia which is easily reducible Musculoskeletal: Gait intact. No edema, tenderness Skin: 3x4 cm firm wart like area frontal lobe Neurologic: Facial musculature symmetric. Psychiatric: Patient acts appropriately  throughout our interaction.  Lymphatic: No cervical or submandibular lymphadenopathy Genitourinary/Anorectal: No acute findings BP recheck in room, sitting (left arm, manual): 132/90 BP recheck in room, sitting (right arm, manual): 130/88   LABS: Results for orders placed or performed in visit on 11/28/15  POCT CBC  Result Value Ref Range   WBC 7.3 4.6 - 10.2 K/uL   Lymph, poc 1.9 0.6 - 3.4   POC LYMPH PERCENT 25.8 10 - 50 %L   MID (cbc) 0.5 0 - 0.9   POC MID % 6.8 0 - 12 %M   POC Granulocyte 4.9 2 - 6.9   Granulocyte percent 67.4 37 - 80 %G   RBC 4.83 4.69 - 6.13 M/uL   Hemoglobin 14.9 14.1 - 18.1 g/dL   HCT, POC 43.6 43.5 - 53.7 %   MCV 90.3 80 - 97 fL   MCH, POC 30.9 27 - 31.2 pg   MCHC 34.2 31.8 - 35.4 g/dL   RDW, POC 12.1 %   Platelet Count, POC 192 142 - 424 K/uL   MPV 8.2 0 - 99.8 fL     EKG/XRAY:     ASSESSMENT/PLAN: Patient has a wart on his head and lesion on the right side of the nose. He has a history of elevated PSA with previous biopsy. This was repeated today along with his basic metabolic panel. He is due for a physical in 2 months and will have his cholesterol done at that time. I placed him on amlodipine 5 mg daily for blood pressure control.I personally performed the services described in this documentation, which was scribed in my presence. The recorded information has been reviewed and is accurate.  Gross sideeffects, risk and benefits, and alternatives of medications d/w patient. Patient is aware that all medications have potential sideeffects and we are unable to predict every sideeffect or drug-drug interaction that may occur.  [name] MD 11/28/2015 11:49 AM

## 2015-11-29 LAB — THYROID PANEL WITH TSH
Free Thyroxine Index: 2 (ref 1.4–3.8)
T3 Uptake: 26 % (ref 22–35)
T4, Total: 7.5 ug/dL (ref 4.5–12.0)
TSH: 1.42 m[IU]/L (ref 0.40–4.50)

## 2015-11-29 LAB — HEPATITIS C ANTIBODY: HCV AB: NEGATIVE

## 2015-12-05 ENCOUNTER — Telehealth: Payer: Self-pay

## 2015-12-05 NOTE — Telephone Encounter (Signed)
Pt has stopped taking his amLODipine (NORVASC) 5 MG tablet [id number]. He feels this med is making him feel light headed and tearing his stomach up. He says he is going to try eating better to lower blood pressure. Please advise at [phone]

## 2015-12-07 NOTE — Telephone Encounter (Signed)
Would he consider taking a half tablet a day and see if he can tolerate this. If not be sure he follows up with [place] in 3-4 weeks to be sure his blood pressure is coming under control with diet and exercise.

## 2015-12-07 NOTE — Telephone Encounter (Signed)
Pt advised.

## 2016-02-18 ENCOUNTER — Ambulatory Visit (INDEPENDENT_AMBULATORY_CARE_PROVIDER_SITE_OTHER): Payer: Commercial Managed Care - PPO | Admitting: Family Medicine

## 2016-02-18 VITALS — BP 132/80 | HR 87 | Temp 98.3°F | Resp 17 | Ht 77.0 in | Wt 226.0 lb

## 2016-02-18 DIAGNOSIS — Z131 Encounter for screening for diabetes mellitus: Secondary | ICD-10-CM | POA: Diagnosis not present

## 2016-02-18 DIAGNOSIS — Z79899 Other long term (current) drug therapy: Secondary | ICD-10-CM | POA: Diagnosis not present

## 2016-02-18 DIAGNOSIS — I10 Essential (primary) hypertension: Secondary | ICD-10-CM

## 2016-02-18 DIAGNOSIS — Z5181 Encounter for therapeutic drug level monitoring: Secondary | ICD-10-CM | POA: Diagnosis not present

## 2016-02-18 DIAGNOSIS — Z298 Encounter for other specified prophylactic measures: Secondary | ICD-10-CM

## 2016-02-18 DIAGNOSIS — Z7189 Other specified counseling: Secondary | ICD-10-CM

## 2016-02-18 DIAGNOSIS — Z7184 Encounter for health counseling related to travel: Secondary | ICD-10-CM

## 2016-02-18 DIAGNOSIS — Z13220 Encounter for screening for lipoid disorders: Secondary | ICD-10-CM | POA: Diagnosis not present

## 2016-02-18 LAB — LIPID PANEL
CHOLESTEROL: 301 mg/dL — AB (ref <200)
HDL: 45 mg/dL (ref >40)
LDL Cholesterol: 228 mg/dL — ABNORMAL HIGH (ref <100)
Total CHOL/HDL Ratio: 6.7 Ratio — ABNORMAL HIGH (ref <5.0)
Triglycerides: 142 mg/dL (ref <150)
VLDL: 28 mg/dL (ref <30)

## 2016-02-18 LAB — HEMOGLOBIN A1C
Hgb A1c MFr Bld: 5.4 % (ref <5.7)
MEAN PLASMA GLUCOSE: 108 mg/dL

## 2016-02-18 MED ORDER — DOXYCYCLINE HYCLATE 100 MG PO TABS: ORAL_TABLET | ORAL | 0 refills | Status: DC

## 2016-02-18 NOTE — Patient Instructions (Addendum)
Keep a record of your blood pressures outside of the office and if you have multiple readings over 140/90 - can increase to 7.5mg  each day (1 and 1/2 pill). Recheck blood pressure in next 3 months. You can make that visit a complete physical in recovery can review other components of your health at that time that we did not cover today.  Doxycycline was prescribed for malaria prophylaxis.  I will check your lithium level, test for diabetes, and cholesterol. Please sign up for mychart today for the easiest way to receive those results.   IF you received an x-ray today, you will receive an invoice from [facility]. Please contact [facility] at [phone] with questions or concerns regarding your invoice.   IF you received labwork today, you will receive an invoice from [employer]. Please contact [facility] at [phone] with questions or concerns regarding your invoice.   Our billing staff will not be able to assist you with questions regarding bills from these companies.  You will be contacted with the lab results as soon as they are available. The fastest way to get your results is to activate your My Chart account. Instructions are located on the last page of this paperwork. If you have not heard from [place] regarding the results in 2 weeks, please contact this office.

## 2016-02-18 NOTE — Progress Notes (Signed)
Subjective:  By signing my name below, I, Terry Padilla, attest that this documentation has been prepared under the direction and in the presence of Merri Ray, MD. Electronically Signed: Moises Padilla, [place]. 02/18/2016 , 11:02 AM .  Patient was seen in Room 11 .   Patient ID: Terry Padilla, male    DOB: 21-Dec-1960, 55 y.o.   MRN: [id number] Chief Complaint  Patient presents with  . Medication Refill    norvasc  . overseas travel    wants medicine for that    HPI Terry Padilla is a 55 y.o. male  H/o HLD, HTN, and bipolar disorder with psychosis.   Travel Patient is planning to travel to [place] and requests doxycycline for malaria prophylaxis in 6 days. He will be gone for about 10 days. He denies complications or side effects with doxycycline previously. His last dose was a year ago.   His mother passed away 3 weeks ago, and is going for a funeral.   HTN His last office visit was with Dr. Everlene Farrier on Aug 23rd, with BP of 146/96. He had significantly elevated BP at 165/101 at behavioral health at that time. He was placed on amlodipine 5mg  qd.   He states Dr. Everlene Farrier saw him initially for elevated BP, and was instructed to take amlodipine 5mg . However, patient was having abdominal pains and nausea so he stopped taking the medication. He called and was informed to try taking half-pill. He stopped having side effects but when he checked his BP, it was running around 150s/90s. He went back to a full pill and without complications, but his numbers are still at around 140s/90s. He is concerned that there was medication interaction from the amlodipine with lithium and prolixin. He denies lightheadedness, shortness of breath, cough, or dizziness.   Cancer Screening Elevated PSA Lab Results  Component Value Date   PSA 3.8 11/28/2015   He has history of elevated PSA. He had previous biopsies done; his PSA had been up to level of 6, then decreased last visit.   Colonoscopy He  reports it was done this year, repeat in 5 years.   Immunizations Immunization History  Administered Date(s) Administered  . Influenza,inj,Quad PF,36+ Mos 01/25/2015   He refuses flu shot today.   Bipolar disorder He's been seen by behavioral health on Oct 3rd, and was stable at that visit. He has rare manic episodes, last one in July 15th. He's treated with prolixin.   Lipid screening Lab Results  Component Value Date   CHOL 253 (H) 01/25/2015   HDL 44 01/25/2015   LDLCALC 184 (H) 01/25/2015   TRIG 127 01/25/2015   CHOLHDL 5.8 (H) 01/25/2015   Plan for this testing today. He is fasting today.   Patient Active Problem List   Diagnosis Date Noted  . Bipolar 1 disorder, manic, moderate ([place]) 11/04/2013  . HYPERLIPIDEMIA 11/04/2007  . 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 Schizophrenia [facility])    Past Surgical History:  Procedure Laterality Date  . TONSILLECTOMY     No Known Allergies Prior to Admission medications   Medication Sig Start Date End Date Taking? Authorizing Provider  amLODipine (NORVASC) 5 MG tablet Take 1 tablet (5 mg total) by mouth daily. 11/28/15  Yes Darlyne Russian, MD  fluPHENAZine decanoate (PROLIXIN) 25 MG/ML injection Inject 12.5 mg into the muscle every 21 ( twenty-one) days.    Yes Historical Provider, MD  lithium carbonate 300 MG  capsule Take 600 mg by mouth 2 (two) times daily with a meal.   Yes Historical Provider, MD   Social History   Social History  . Marital status: Married    Spouse name: N/A  . Number of children: N/A  . Years of education: N/A   Occupational History  . Not on file.   Social History Main Topics  . Smoking status: Never Smoker  . Smokeless tobacco: Never Used  . Alcohol use No  . Drug use: No  . Sexual activity: Not on file   Other Topics Concern  . Not on file   Social History Narrative  . No narrative on file   Review of Systems  Constitutional: Negative for fatigue  and unexpected weight change.  Eyes: Negative for visual disturbance.  Respiratory: Negative for cough, chest tightness and shortness of breath.   Cardiovascular: Negative for chest pain, palpitations and leg swelling.  Gastrointestinal: Negative for abdominal pain and Padilla in stool.  Neurological: Negative for dizziness, light-headedness and headaches.       Objective:   Physical Exam  Constitutional: He is oriented to person, place, and time. He appears well-developed and well-nourished.  HENT:  Head: Normocephalic and atraumatic.  Eyes: EOM are normal. Pupils are equal, round, and reactive to light.  Neck: No JVD present. Carotid bruit is not present.  Cardiovascular: Normal rate, regular rhythm and normal heart sounds.   No murmur heard. Pulmonary/Chest: Effort normal and breath sounds normal. He has no rales.  Musculoskeletal: He exhibits no edema.  Neurological: He is alert and oriented to person, place, and time.  Skin: Skin is warm and dry.  Psychiatric: He has a normal mood and affect.  Vitals reviewed.   Vitals:   02/18/16 0952  BP: 132/80  Pulse: 87  Resp: 17  Temp: 98.3 F (36.8 C)  TempSrc: Oral  SpO2: 97%  Weight: 226 lb (102.5 kg)  Height: 6\' 5"  (1.956 m)      Assessment & Plan:   Terry Padilla is a 55 y.o. male Essential hypertension  -Overall stable. May have borderline elevation out of office, but based on level today, may not need increase. If remaining elevated out of office - can increase to 7.5mg  qd.    -Recheck in 3 months.   Need for malaria prophylaxis Travel advice encounter - Plan: doxycycline (VIBRA-TABS) 100 MG tablet  - Doxycycline provided for prophylaxis  -Recommended flu vaccine, especially with upcoming travel, but this was declined.  Screening for diabetes mellitus - Plan: Hemoglobin A1C  -Previous glucose looks okay, but with his other medications and by patient request, will check A1c.  Encounter for lithium monitoring -  Plan: Lithium level  -Recent TSH okay, lithium level pending, can decide on any changes with his mental health provider with these results are known.  Screening for hyperlipidemia - Plan: Lipid panel   Meds ordered this encounter  Medications  . doxycycline (VIBRA-TABS) 100 MG tablet    Sig: One tablet day of trip during your trip and for one month upon return    Dispense:  60 tablet    Refill:  0   Patient Instructions   Keep a record of your Padilla pressures outside of the office and if you have multiple readings over 140/90 - can increase to 7.5mg  each day (1 and 1/2 pill). Recheck Padilla pressure in next 3 months. You can make that visit a complete physical in recovery can review other components of your health at  that time that we did not cover today.  Doxycycline was prescribed for malaria prophylaxis.  I will check your lithium level, test for diabetes, and cholesterol. Please sign up for mychart today for the easiest way to receive those results.   IF you received an x-ray today, you will receive an invoice from [facility]. Please contact [facility] at [phone] with questions or concerns regarding your invoice.   IF you received labwork today, you will receive an invoice from [employer]. Please contact [facility] at [phone] with questions or concerns regarding your invoice.   Our billing staff will not be able to assist you with questions regarding bills from these companies.  You will be contacted with the lab results as soon as they are available. The fastest way to get your results is to activate your My Chart account. Instructions are located on the last page of this paperwork. If you have not heard from [place] regarding the results in 2 weeks, please contact this office.        I personally performed the services described in this documentation, which was scribed in my presence. The recorded information has been reviewed  and considered, and addended by me as needed.   Signed,   Merri Ray, MD [facility].  02/18/16 1:31 PM

## 2016-02-20 LAB — LITHIUM LEVEL: LITHIUM LVL: 0.5 mmol/L — AB (ref 0.6–1.2)

## 2016-03-08 ENCOUNTER — Other Ambulatory Visit: Payer: Self-pay | Admitting: Family Medicine

## 2016-03-08 MED ORDER — ROSUVASTATIN CALCIUM 10 MG PO TABS: 10.0000 mg | ORAL_TABLET | Freq: Every day | ORAL | 0 refills | Status: DC

## 2016-03-08 NOTE — Progress Notes (Unsigned)
crestor sent in.  Return for repeat lipid and liver tests in 6-8 weeks.

## 2016-03-08 NOTE — Progress Notes (Signed)
Informed pt of crestor prescription and to RTC in 6-8weeks. Pt voiced understanding and stated he will call back on Monday 12/4 to set up an appt.

## 2016-03-10 ENCOUNTER — Telehealth: Payer: Self-pay

## 2016-03-10 NOTE — Telephone Encounter (Signed)
Pt called and stated that his rosuvastatin Rx needs PA. Checked his ins formulary and it is covered as a tier 1 generic (no PA needed). Called pharm and had them re-check it. When they reprocessed it went through just fine for $15. Called and advised pt.

## 2016-03-21 ENCOUNTER — Telehealth: Payer: Self-pay

## 2016-03-21 NOTE — Telephone Encounter (Signed)
Patient is calling because the pharmacy told him that he needs a PA in order to get a refill for doxycycline. Please advise!   [phone]

## 2016-03-24 NOTE — Telephone Encounter (Signed)
Pt states that he came back Nov 28 th and he need to be on Doxy for 30 days after he came back into country

## 2016-03-24 NOTE — Telephone Encounter (Signed)
Pt calling about his refill on Doxy see previous message he stated that he need authorization from pharmacy for Doxy refill

## 2016-03-24 NOTE — Telephone Encounter (Signed)
Terry Padilla is callling to get preauthorization for the doxycycline prescription prescribed by Dr. Carlota Raspberry.  Please advise!  [phone]

## 2016-03-25 ENCOUNTER — Telehealth: Payer: Self-pay | Admitting: Emergency Medicine

## 2016-03-25 NOTE — Telephone Encounter (Signed)
Pt called in regarding PA for medication refill Doxycycline. States, he took his last dose today and will need RF tomorrow.  Message routed to [facility] for f/u

## 2016-03-25 NOTE — Telephone Encounter (Signed)
Routed to barb

## 2016-03-25 NOTE — Telephone Encounter (Signed)
Called ins and they did PA and then checked for coverage for capsules instead. This was the problem. Ins will cover the caps, 30 for 30 days. Called pharm and gave them order verbally for caps and it went through. Called pt and advised.

## 2016-03-26 NOTE — Telephone Encounter (Signed)
This was done, had to change to caps. Pt aware

## 2016-04-07 ENCOUNTER — Other Ambulatory Visit: Payer: Self-pay | Admitting: Family Medicine

## 2016-06-09 ENCOUNTER — Other Ambulatory Visit: Payer: Self-pay | Admitting: Family Medicine

## 2016-06-10 NOTE — Telephone Encounter (Signed)
Patient notified via My Chart. Needs OV and fasting labs before runs out.  Meds ordered this encounter  Medications  . rosuvastatin (CRESTOR) 10 MG tablet    Sig: TAKE ONE TABLET BY MOUTH ONCE DAILY    Dispense:  90 tablet    Refill:  0

## 2016-06-11 ENCOUNTER — Other Ambulatory Visit: Payer: Self-pay | Admitting: Nephrology

## 2016-06-11 DIAGNOSIS — N183 Chronic kidney disease, stage 3 unspecified: Secondary | ICD-10-CM

## 2016-06-16 ENCOUNTER — Other Ambulatory Visit (HOSPITAL_COMMUNITY): Payer: Self-pay | Admitting: Nephrology

## 2016-06-16 DIAGNOSIS — N183 Chronic kidney disease, stage 3 unspecified: Secondary | ICD-10-CM

## 2016-06-19 ENCOUNTER — Ambulatory Visit (HOSPITAL_COMMUNITY)
Admission: RE | Admit: 2016-06-19 | Discharge: 2016-06-19 | Disposition: A | Discharge status: Home / Self Care | Payer: Commercial Managed Care - PPO | Source: Ambulatory Visit | Attending: Nephrology | Admitting: Nephrology

## 2016-06-19 DIAGNOSIS — N183 Chronic kidney disease, stage 3 unspecified: Secondary | ICD-10-CM

## 2016-06-24 ENCOUNTER — Other Ambulatory Visit: Payer: Self-pay | Admitting: Student

## 2016-06-24 ENCOUNTER — Other Ambulatory Visit: Payer: Self-pay | Admitting: General Surgery

## 2016-06-24 ENCOUNTER — Other Ambulatory Visit: Payer: Self-pay | Admitting: Radiology

## 2016-06-25 ENCOUNTER — Ambulatory Visit (HOSPITAL_COMMUNITY)
Admission: RE | Admit: 2016-06-25 | Discharge: 2016-06-25 | Disposition: A | Discharge status: Home / Self Care | Payer: Commercial Managed Care - PPO | Source: Ambulatory Visit | Attending: Nephrology | Admitting: Nephrology

## 2016-06-25 ENCOUNTER — Encounter (HOSPITAL_COMMUNITY): Payer: Self-pay

## 2016-06-25 DIAGNOSIS — N183 Chronic kidney disease, stage 3 unspecified: Secondary | ICD-10-CM

## 2016-06-25 DIAGNOSIS — I129 Hypertensive chronic kidney disease with stage 1 through stage 4 chronic kidney disease, or unspecified chronic kidney disease: Secondary | ICD-10-CM | POA: Insufficient documentation

## 2016-06-25 DIAGNOSIS — N119 Chronic tubulo-interstitial nephritis, unspecified: Secondary | ICD-10-CM | POA: Insufficient documentation

## 2016-06-25 HISTORY — DX: Essential (primary) hypertension: I10

## 2016-06-25 HISTORY — DX: Type 2 diabetes mellitus without complications: E11.9

## 2016-06-25 LAB — CBC WITH DIFFERENTIAL/PLATELET
BASOS ABS: 0 10*3/uL (ref 0.0–0.1)
Basophils Relative: 0 %
Eosinophils Absolute: 0.2 10*3/uL (ref 0.0–0.7)
Eosinophils Relative: 3 %
HEMATOCRIT: 38.7 % — AB (ref 39.0–52.0)
Hemoglobin: 12.3 g/dL — ABNORMAL LOW (ref 13.0–17.0)
LYMPHS ABS: 1 10*3/uL (ref 0.7–4.0)
LYMPHS PCT: 13 %
MCH: 29.9 pg (ref 26.0–34.0)
MCHC: 31.8 g/dL (ref 30.0–36.0)
MCV: 93.9 fL (ref 78.0–100.0)
MONOS PCT: 6 %
Monocytes Absolute: 0.5 10*3/uL (ref 0.1–1.0)
Neutro Abs: 6.3 10*3/uL (ref 1.7–7.7)
Neutrophils Relative %: 78 %
Platelets: 195 10*3/uL (ref 150–400)
RBC: 4.12 MIL/uL — ABNORMAL LOW (ref 4.22–5.81)
RDW: 13.1 % (ref 11.5–15.5)
WBC: 8 10*3/uL (ref 4.0–10.5)

## 2016-06-25 LAB — BASIC METABOLIC PANEL
Anion gap: 8 (ref 5–15)
BUN: 22 mg/dL — ABNORMAL HIGH (ref 6–20)
CHLORIDE: 114 mmol/L — AB (ref 101–111)
CO2: 26 mmol/L (ref 22–32)
Calcium: 10.1 mg/dL (ref 8.9–10.3)
Creatinine, Ser: 3.21 mg/dL — ABNORMAL HIGH (ref 0.61–1.24)
GFR calc non Af Amer: 20 mL/min — ABNORMAL LOW (ref >60)
GFR, EST AFRICAN AMERICAN: 23 mL/min — AB (ref >60)
Glucose, Bld: 108 mg/dL — ABNORMAL HIGH (ref 65–99)
POTASSIUM: 4.1 mmol/L (ref 3.5–5.1)
SODIUM: 148 mmol/L — AB (ref 135–145)

## 2016-06-25 LAB — PROTIME-INR
INR: 1.07
Prothrombin Time: 13.9 seconds (ref 11.4–15.2)

## 2016-06-25 MED ORDER — LIDOCAINE HCL 1 % IJ SOLN
INTRAMUSCULAR | Status: AC
  Filled 2016-06-25: qty 20

## 2016-06-25 MED ORDER — FENTANYL CITRATE (PF) 100 MCG/2ML IJ SOLN
INTRAMUSCULAR | Status: AC | PRN
  Administered 2016-06-25: 50 ug via INTRAVENOUS
  Administered 2016-06-25 (×2): 25 ug via INTRAVENOUS

## 2016-06-25 MED ORDER — SODIUM CHLORIDE 0.9 % IV SOLN
INTRAVENOUS | Status: AC | PRN
  Administered 2016-06-25: 10 mL/h via INTRAVENOUS

## 2016-06-25 MED ORDER — HYDROCODONE-ACETAMINOPHEN 5-325 MG PO TABS: 1.0000 | ORAL_TABLET | ORAL | Status: DC | PRN

## 2016-06-25 MED ORDER — FENTANYL CITRATE (PF) 100 MCG/2ML IJ SOLN
INTRAMUSCULAR | Status: AC
  Filled 2016-06-25: qty 2

## 2016-06-25 MED ORDER — MIDAZOLAM HCL 2 MG/2ML IJ SOLN
INTRAMUSCULAR | Status: AC | PRN
  Administered 2016-06-25 (×2): 1 mg via INTRAVENOUS

## 2016-06-25 MED ORDER — MIDAZOLAM HCL 2 MG/2ML IJ SOLN
INTRAMUSCULAR | Status: AC
  Filled 2016-06-25: qty 2

## 2016-06-25 MED ORDER — SODIUM CHLORIDE 0.9 % IV SOLN: INTRAVENOUS | Status: DC

## 2016-06-25 NOTE — Discharge Instructions (Addendum)
Percutaneous Kidney Biopsy, Care After °This sheet gives you information about how to care for yourself after your procedure. Your health care provider may also give you more specific instructions. If you have problems or questions, contact your health care provider. °What can I expect after the procedure? °After the procedure, it is common to have: °· Pain or soreness near the area where the needle went through your skin (biopsy site). °· Bright pink or cloudy urine for 24 hours after the procedure. °Follow these instructions at home: °Activity  °· Return to your normal activities as told by your health care provider. Ask your health care provider what activities are safe for you. °· Do not drive for 24 hours if you were given a medicine to help you relax (sedative). °· Do not lift anything that is heavier than 10 lb (4.5 kg) until your health care provider tells you that it is safe. °· Avoid activities that take a lot of effort (are strenuous) until your health care provider approves. Most people will have to wait 2 weeks before returning to activities such as exercise or sexual intercourse. °General instructions  °· Take over-the-counter and prescription medicines only as told by your health care provider. °· You may eat and drink after your procedure. Follow instructions from your health care provider about eating or drinking restrictions. °· Check your biopsy site every day for signs of infection. Check for: °¨ More redness, swelling, or pain. °¨ More fluid or blood. °¨ Warmth. °¨ Pus or a bad smell. °· Keep all follow-up visits as told by your health care provider. This is important. °Contact a health care provider if: °· You have more redness, swelling, or pain around your biopsy site. °· You have more fluid or blood coming from your biopsy site. °· Your biopsy site feels warm to the touch. °· You have pus or a bad smell coming from your biopsy site. °· You have blood in your urine more than 24 hours after  your procedure. °Get help right away if: °· You have dark red or brown urine. °· You have a fever. °· You are unable to urinate. °· You feel burning when you urinate. °· You feel faint. °· You have severe pain in your abdomen or side. °This information is not intended to replace advice given to you by your health care provider. Make sure you discuss any questions you have with your health care provider. °Document Released: 11/24/2012 Document Revised: 01/04/2016 Document Reviewed: 01/04/2016 °Elsevier Interactive Patient Education © 2017 [employer] °Moderate Conscious Sedation, Adult, Care After °These instructions provide you with information about caring for yourself after your procedure. Your health care provider may also give you more specific instructions. Your treatment has been planned according to current medical practices, but problems sometimes occur. Call your health care provider if you have any problems or questions after your procedure. °What can I expect after the procedure? °After your procedure, it is common: °· To feel sleepy for several hours. °· To feel clumsy and have poor balance for several hours. °· To have poor judgment for several hours. °· To vomit if you eat too soon. °Follow these instructions at home: °For at least 24 hours after the procedure:  ° °· Do not: °¨ Participate in activities where you could fall or become injured. °¨ Drive. °¨ Use heavy machinery. °¨ Drink alcohol. °¨ Take sleeping pills or medicines that cause drowsiness. °¨ Make important decisions or sign legal documents. °¨ Take care of   children on your own. °· Rest. °Eating and drinking  °· Follow the diet recommended by your health care provider. °· If you vomit: °¨ Drink water, juice, or soup when you can drink without vomiting. °¨ Make sure you have little or no nausea before eating solid foods. °General instructions  °· Have a responsible adult stay with you until you are awake and alert. °· Take over-the-counter  and prescription medicines only as told by your health care provider. °· If you smoke, do not smoke without supervision. °· Keep all follow-up visits as told by your health care provider. This is important. °Contact a health care provider if: °· You keep feeling nauseous or you keep vomiting. °· You feel light-headed. °· You develop a rash. °· You have a fever. °Get help right away if: °· You have trouble breathing. °This information is not intended to replace advice given to you by your health care provider. Make sure you discuss any questions you have with your health care provider. °Document Released: 01/12/2013 Document Revised: 08/27/2015 Document Reviewed: 07/14/2015 °Elsevier Interactive Patient Education © 2017 [employer]. ° °

## 2016-06-25 NOTE — H&P (Signed)
Chief Complaint: chronic kidney disease  Referring Physician:Dr. Donato Heinz  Supervising Physician: Markus Daft  Patient Status: [facility] - Out-pt  HPI: Terry Padilla is an 56 y.o. male who was recently diagnosed by his PCP with chronic kidney disease.  He was diagnosed with HTN in Oct or November of last year per the patient and takes Norvasc.  He has been referred to Dr. Marval Regal secondary to abnormal labs findings and his elevated Cr, which is 3.21 today.  He presents today for a random renal biopsy.   Past Medical History:  Past Medical History:  Diagnosis Date  . Diabetes mellitus without complication ([place])   . Hypertension   . Schizophrenia [facility])     Past Surgical History:  Past Surgical History:  Procedure Laterality Date  . TONSILLECTOMY      Family History: History reviewed. No pertinent family history.  Social History:  reports that he has never smoked. He has never used smokeless tobacco. He reports that he does not drink alcohol or use drugs.  Allergies: No Known Allergies  Medications: Medications reviewed in epic  Please HPI for pertinent positives, otherwise complete 10 system ROS negative.  Mallampati Score: MD Evaluation Airway: WNL Heart: WNL Abdomen: WNL Chest/ Lungs: WNL ASA  Classification: 2 Mallampati/Airway Score: Two  Physical Exam: BP 132/80 (BP Location: Left Arm)   Pulse 97   Temp 98.7 F (37.1 C) (Oral)   Resp 18   Ht 6' 3"  (1.905 m)   Wt 198 lb (89.8 kg)   SpO2 98%   BMI 24.75 kg/m  Body mass index is 24.75 kg/m. General: pleasant, WD, WN black male who is laying in bed in NAD HEENT: head is normocephalic, atraumatic.  Sclera are noninjected.  PERRL.  Ears and nose without any masses or lesions.  Mouth is pink and moist Heart: regular, rate, and rhythm.  Normal s1,s2. No obvious murmurs, gallops, or rubs noted.  Palpable radial and pedal pulses bilaterally Lungs: CTAB, no wheezes, rhonchi, or rales noted.  Respiratory  effort nonlabored Abd: soft, NT, ND, +BS, no masses, hernias, or organomegaly Psych: A&Ox3 with an appropriate affect.   Labs: Results for orders placed or performed during the hospital encounter of 06/25/16 (from the past 48 hour(s))  Basic metabolic panel     Status: Abnormal   Collection Time: 06/25/16  6:30 AM  Result Value Ref Range   Sodium 148 (H) 135 - 145 mmol/L   Potassium 4.1 3.5 - 5.1 mmol/L   Chloride 114 (H) 101 - 111 mmol/L   CO2 26 22 - 32 mmol/L   Glucose, Bld 108 (H) 65 - 99 mg/dL   BUN 22 (H) 6 - 20 mg/dL   Creatinine, Ser 3.21 (H) 0.61 - 1.24 mg/dL   Calcium 10.1 8.9 - 10.3 mg/dL   GFR calc non Af Amer 20 (L) >60 mL/min   GFR calc Af Amer 23 (L) >60 mL/min    Comment: (NOTE) The eGFR has been calculated using the CKD EPI equation. This calculation has not been validated in all clinical situations. eGFR's persistently <60 mL/min signify possible Chronic Kidney Disease.    Anion gap 8 5 - 15  CBC with Differential/Platelet     Status: Abnormal   Collection Time: 06/25/16  6:30 AM  Result Value Ref Range   WBC 8.0 4.0 - 10.5 K/uL   RBC 4.12 (L) 4.22 - 5.81 MIL/uL   Hemoglobin 12.3 (L) 13.0 - 17.0 g/dL   HCT 38.7 (L)  39.0 - 52.0 %   MCV 93.9 78.0 - 100.0 fL   MCH 29.9 26.0 - 34.0 pg   MCHC 31.8 30.0 - 36.0 g/dL   RDW 13.1 11.5 - 15.5 %   Platelets 195 150 - 400 K/uL   Neutrophils Relative % 78 %   Neutro Abs 6.3 1.7 - 7.7 K/uL   Lymphocytes Relative 13 %   Lymphs Abs 1.0 0.7 - 4.0 K/uL   Monocytes Relative 6 %   Monocytes Absolute 0.5 0.1 - 1.0 K/uL   Eosinophils Relative 3 %   Eosinophils Absolute 0.2 0.0 - 0.7 K/uL   Basophils Relative 0 %   Basophils Absolute 0.0 0.0 - 0.1 K/uL  Protime-INR     Status: None   Collection Time: 06/25/16  6:30 AM  Result Value Ref Range   Prothrombin Time 13.9 11.4 - 15.2 seconds   INR 1.07     Imaging: No results found.  Assessment/Plan 1. Chronic kidney disease -we will plan to proceed with a random renal  biopsy today -his blood pressure on the monitor is saying 146/90, but manually it is 132/80.  He has taken his Norvasc this morning. -other labs and vitals reviewed -Risks and Benefits discussed with the patient including, but not limited to bleeding, infection, damage to adjacent structures or low yield requiring additional tests. All of the patient's questions were answered, patient is agreeable to proceed. Consent signed and in chart.  Thank you for this interesting consult.  I greatly enjoyed meeting MACKSON BOTZ and look forward to participating in their care.  A copy of this report was sent to the requesting provider on this date.  Electronically Signed: Henreitta Cea 06/25/2016, 7:50 AM   I spent a total of  30 Minutes   in face to face in clinical consultation, greater than 50% of which was counseling/coordinating care for chronic kidney disease

## 2016-06-25 NOTE — Procedures (Signed)
  Pre-operative Diagnosis: Chronic kidney disease       Post-operative Diagnosis: Chronic kidney disease   Indications: Chronic kidney disease  Procedure: US guided biopsy of left kidney  Findings: 2 cores obtained from left kidney lower pole.  No significant bleeding or hematoma after the biopsy.    Complications: No immediate      EBL: Minimal  Plan: Strict bedrest for 4 hours.  After 4 hours, will assess if patient can be discharged.

## 2016-07-07 ENCOUNTER — Encounter (HOSPITAL_COMMUNITY): Payer: Self-pay

## 2016-07-25 ENCOUNTER — Encounter (HOSPITAL_COMMUNITY): Payer: Self-pay

## 2016-09-15 ENCOUNTER — Other Ambulatory Visit: Payer: Self-pay | Admitting: Physician Assistant

## 2016-09-18 ENCOUNTER — Ambulatory Visit: Payer: Commercial Managed Care - PPO | Admitting: Family Medicine

## 2017-06-16 DIAGNOSIS — F319 Bipolar disorder, unspecified: Secondary | ICD-10-CM | POA: Diagnosis not present

## 2017-07-07 DIAGNOSIS — N183 Chronic kidney disease, stage 3 (moderate): Secondary | ICD-10-CM | POA: Diagnosis not present

## 2017-07-07 DIAGNOSIS — F319 Bipolar disorder, unspecified: Secondary | ICD-10-CM | POA: Diagnosis not present

## 2017-07-13 DIAGNOSIS — R319 Hematuria, unspecified: Secondary | ICD-10-CM | POA: Diagnosis not present

## 2017-07-13 DIAGNOSIS — D631 Anemia in chronic kidney disease: Secondary | ICD-10-CM | POA: Diagnosis not present

## 2017-07-13 DIAGNOSIS — I129 Hypertensive chronic kidney disease with stage 1 through stage 4 chronic kidney disease, or unspecified chronic kidney disease: Secondary | ICD-10-CM | POA: Diagnosis not present

## 2017-07-13 DIAGNOSIS — N183 Chronic kidney disease, stage 3 (moderate): Secondary | ICD-10-CM | POA: Diagnosis not present

## 2017-07-28 DIAGNOSIS — F319 Bipolar disorder, unspecified: Secondary | ICD-10-CM | POA: Diagnosis not present

## 2017-08-17 DIAGNOSIS — I10 Essential (primary) hypertension: Secondary | ICD-10-CM | POA: Diagnosis not present

## 2017-08-17 DIAGNOSIS — N183 Chronic kidney disease, stage 3 (moderate): Secondary | ICD-10-CM | POA: Diagnosis not present

## 2017-08-17 DIAGNOSIS — E559 Vitamin D deficiency, unspecified: Secondary | ICD-10-CM | POA: Diagnosis not present

## 2017-08-17 DIAGNOSIS — R972 Elevated prostate specific antigen [PSA]: Secondary | ICD-10-CM | POA: Diagnosis not present

## 2017-08-17 DIAGNOSIS — Z79899 Other long term (current) drug therapy: Secondary | ICD-10-CM | POA: Diagnosis not present

## 2017-08-17 DIAGNOSIS — E7800 Pure hypercholesterolemia, unspecified: Secondary | ICD-10-CM | POA: Diagnosis not present

## 2017-08-18 DIAGNOSIS — F319 Bipolar disorder, unspecified: Secondary | ICD-10-CM | POA: Diagnosis not present

## 2017-09-08 DIAGNOSIS — F319 Bipolar disorder, unspecified: Secondary | ICD-10-CM | POA: Diagnosis not present

## 2017-09-17 DIAGNOSIS — H43811 Vitreous degeneration, right eye: Secondary | ICD-10-CM | POA: Diagnosis not present

## 2018-06-09 ENCOUNTER — Ambulatory Visit: Payer: Self-pay | Admitting: Family Medicine

## 2018-06-09 ENCOUNTER — Encounter: Payer: Self-pay | Admitting: Family Medicine

## 2018-06-09 ENCOUNTER — Other Ambulatory Visit: Payer: Self-pay

## 2018-06-09 VITALS — BP 136/78 | HR 67 | Temp 99.0°F | Resp 14 | Ht 76.0 in | Wt 211.2 lb

## 2018-06-09 DIAGNOSIS — E785 Hyperlipidemia, unspecified: Secondary | ICD-10-CM

## 2018-06-09 DIAGNOSIS — I10 Essential (primary) hypertension: Secondary | ICD-10-CM

## 2018-06-09 DIAGNOSIS — R7989 Other specified abnormal findings of blood chemistry: Secondary | ICD-10-CM

## 2018-06-09 DIAGNOSIS — R972 Elevated prostate specific antigen [PSA]: Secondary | ICD-10-CM

## 2018-06-09 MED ORDER — ROSUVASTATIN CALCIUM 10 MG PO TABS: 10.0000 mg | ORAL_TABLET | Freq: Every day | ORAL | 0 refills | Status: AC

## 2018-06-09 MED ORDER — AMLODIPINE BESYLATE 10 MG PO TABS: 10.0000 mg | ORAL_TABLET | Freq: Every day | ORAL | 0 refills | Status: DC

## 2018-06-09 NOTE — Patient Instructions (Addendum)
Keep a record of your blood pressures outside of the office and bring them to the next office visit.  Today's reading looks okay.  I would also like to look at the recent kidney tests before making any decisions on changes of medication.  I will request some lab work from your previous provider.  If PSA is elevated I would recommend follow-up with urology.  Let me know when it is okay to refer you.  Follow-up with me in 1 month to review labs and medications further.  How to Take Your Blood Pressure Blood pressure is a measurement of how strongly your blood is pressing against the walls of your arteries. Arteries are blood vessels that carry blood from your heart throughout your body. Your health care provider takes your blood pressure at each office visit. You can also take your own blood pressure at home with a blood pressure machine. You may need to take your own blood pressure:  To confirm a diagnosis of high blood pressure (hypertension).  To monitor your blood pressure over time.  To make sure your blood pressure medicine is working. Supplies needed: To take your blood pressure, you will need a blood pressure machine. You can buy a blood pressure machine, or blood pressure monitor, at most drugstores or online. There are several types of home blood pressure monitors. When choosing one, consider the following:  Choose a monitor that has an arm cuff.  Choose a cuff that wraps snugly around your upper arm. You should be able to fit only one finger between your arm and the cuff.  Do not choose a monitor that measures your blood pressure from your wrist or finger. Your health care provider can suggest a reliable monitor that will meet your needs. How to prepare To get the most accurate reading, avoid the following for 30 minutes before you check your blood pressure:  Drinking caffeine.  Drinking alcohol.  Eating.  Smoking.  Exercising. Five minutes before you check your blood  pressure:  Empty your bladder.  Sit quietly without talking in a dining chair, rather than in a soft couch or armchair. How to take your blood pressure To check your blood pressure, follow the instructions in the manual that came with your blood pressure monitor. If you have a digital blood pressure monitor, the instructions may be as follows: 1. Sit up straight. 2. Place your feet on the floor. Do not cross your ankles or legs. 3. Rest your left arm at the level of your heart on a table or desk or on the arm of a chair. 4. Pull up your shirt sleeve. 5. Wrap the blood pressure cuff around the upper part of your left arm, 1 inch (2.5 cm) above your elbow. It is best to wrap the cuff around bare skin. 6. Fit the cuff snugly around your arm. You should be able to place only one finger between the cuff and your arm. 7. Position the cord inside the groove of your elbow. 8. Press the power button. 9. Sit quietly while the cuff inflates and deflates. 10. Read the digital reading on the monitor screen and write it down (record it). 11. Wait 2-3 minutes, then repeat the steps, starting at step 1. What does my blood pressure reading mean? A blood pressure reading consists of a higher number over a lower number. Ideally, your blood pressure should be below 120/80. The first ("top") number is called the systolic pressure. It is a measure of the pressure in  your arteries as your heart beats. The second ("bottom") number is called the diastolic pressure. It is a measure of the pressure in your arteries as the heart relaxes. Blood pressure is classified into four stages. The following are the stages for adults who do not have a short-term serious illness or a chronic condition. Systolic pressure and diastolic pressure are measured in a unit called mm Hg. Normal  Systolic pressure: below 573.  Diastolic pressure: below 80. Elevated  Systolic pressure: 220-254.  Diastolic pressure: below  80. Hypertension stage 1  Systolic pressure: 270-623.  Diastolic pressure: 76-28. Hypertension stage 2  Systolic pressure: 315 or above.  Diastolic pressure: 90 or above. You can have prehypertension or hypertension even if only the systolic or only the diastolic number in your reading is higher than normal. Follow these instructions at home:  Check your blood pressure as often as recommended by your health care provider.  Take your monitor to the next appointment with your health care provider to make sure: ? That you are using it correctly. ? That it provides accurate readings.  Be sure you understand what your goal blood pressure numbers are.  Tell your health care provider if you are having any side effects from blood pressure medicine. Contact a health care provider if:  Your blood pressure is consistently high. Get help right away if:  Your systolic blood pressure is higher than 180.  Your diastolic blood pressure is higher than 110. This information is not intended to replace advice given to you by your health care provider. Make sure you discuss any questions you have with your health care provider. Document Released: 08/31/2015 Document Revised: 02/03/2017 Document Reviewed: 08/31/2015 Elsevier Interactive Patient Education  [employer].   If you have lab work done today you will be contacted with your lab results within the next 2 weeks.  If you have not heard from [place] then please contact us. The fastest way to get your results is to register for My Chart.   IF you received an x-ray today, you will receive an invoice from [facility]. Please contact [facility] at [phone] with questions or concerns regarding your invoice.   IF you received labwork today, you will receive an invoice from [place]. Please contact [facility] at [phone] with questions or concerns regarding your invoice.   Our billing staff will not be able to assist you  with questions regarding bills from these companies.  You will be contacted with the lab results as soon as they are available. The fastest way to get your results is to activate your My Chart account. Instructions are located on the last page of this paperwork. If you have not heard from [place] regarding the results in 2 weeks, please contact this office.

## 2018-06-09 NOTE — Progress Notes (Signed)
Subjective:    Patient ID: Terry Padilla, male    DOB: November 30, 1960, 58 y.o.   MRN: [id number]  HPI Terry Padilla is a 58 y.o. male Presents today for: Chief Complaint  Patient presents with  . Hypertension    home bp reading has been elevatd at home. This is a concern for me. I brought in my bp machine from home to compare to our bp reading. Today bp in the office was 136/78   I am listed as his primary care provider, but I have not seen him since November 2017.  History of hypertension, hyperlipidemia, prediabetes/diabetes, bipolar disorder (followed by [facility] behavioral health). Has been followed by [facility] prior.   Hypertension: BP Readings from Last 3 Encounters:  06/09/18 136/78  06/25/16 132/85  02/18/16 132/80   Lab Results  Component Value Date   CREATININE 3.21 (H) 06/25/2016  Has been taking amlodipine 10 mg daily. No recent missed dose - not yet taken today.  Home readings - 131/88. 145/94, 151/94, 157/100.  No alcohol. No tobacco.  Most recent creatinine significant elevated above in March 2018, but previous range approximately 1.0-1.33 has nephrologist now - lithium dose was high and kidney function issues.  Off lithium now and improving.   Prediabetes: No recent testing.  Sugar is "good". Reports A1c 5.0 few months ago at [place] from Last 3 Encounters:  06/09/18 211 lb 3.2 oz (95.8 kg)  06/25/16 198 lb (89.8 kg)  02/18/16 226 lb (102.5 kg)    Lab Results  Component Value Date   HGBA1C 5.4 02/18/2016   Hyperlipidemia:  Lab Results  Component Value Date   CHOL 301 (H) 02/18/2016   HDL 45 02/18/2016   LDLCALC 228 (H) 02/18/2016   TRIG 142 02/18/2016   CHOLHDL 6.7 (H) 02/18/2016   Lab Results  Component Value Date   ALT 17 11/28/2015   AST 17 11/28/2015   ALKPHOS 57 11/28/2015   BILITOT 0.8 11/28/2015  Crestor 10 mg daily in the past.  Appears this was most recently filled June 10, 2016 for number 90  tablets Last tested 3-4 months ago at [facility].   PSA creeping up - up to 6 recently. Has not seen urology recently.  Had eval many years ago with biopsy that was ok.  Last checked a month ago.  Would like to wait on insurance to consider urology referral.      Patient Active Problem List   Diagnosis Date Noted  . Bipolar 1 disorder, manic, moderate ([place]) 11/04/2013  . HYPERLIPIDEMIA 11/04/2007  . 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 Diabetes mellitus without complication ([place])   . Hypertension   . Schizophrenia [facility])    Past Surgical History:  Procedure Laterality Date  . TONSILLECTOMY     No Known Allergies Prior to Admission medications   Medication Sig Start Date End Date Taking? Authorizing Provider  amLODipine (NORVASC) 5 MG tablet Take 1 tablet (5 mg total) by mouth daily. 11/28/15  Yes Darlyne Russian, MD  fluPHENAZine decanoate (PROLIXIN) 25 MG/ML injection Inject 12.5 mg into the muscle every 21 ( twenty-one) days.    Yes [provider]  Multiple Vitamins-Minerals (MULTIVITAMIN WITH MINERALS) tablet Take 1 tablet by mouth daily.   Yes [provider]  Oxcarbazepine (TRILEPTAL) 300 MG tablet Take 300 mg by mouth 2 (two) times daily.   Yes [provider]  PSYLLIUM HUSK PO  Take 1 scoop by mouth daily.   Yes [provider]  rosuvastatin (CRESTOR) 10 MG tablet TAKE ONE TABLET BY MOUTH ONCE DAILY 06/10/16  Yes Harrison Mons, PA   Social History   Socioeconomic History  . Marital status: Married    Spouse name: Not on file  . Number of children: Not on file  . Years of education: Not on file  . Highest education level: Not on file  Occupational History  . Not on file  Social Needs  . Financial resource strain: Not on file  . Food insecurity:    Worry: Not on file    Inability: Not on file  . Transportation needs:    Medical: Not on file    Non-medical: Not on file  Tobacco Use   . Smoking status: Never Smoker  . Smokeless tobacco: Never Used  Substance and Sexual Activity  . Alcohol use: No    Alcohol/week: 0.0 standard drinks  . Drug use: No  . Sexual activity: Not on file  Lifestyle  . Physical activity:    Days per week: Not on file    Minutes per session: Not on file  . Stress: Not on file  Relationships  . Social connections:    Talks on phone: Not on file    Gets together: Not on file    Attends religious service: Not on file    Active member of club or organization: Not on file    Attends meetings of clubs or organizations: Not on file    Relationship status: Not on file  . Intimate partner violence:    Fear of current or ex partner: Not on file    Emotionally abused: Not on file    Physically abused: Not on file    Forced sexual activity: Not on file  Other Topics Concern  . Not on file  Social History Narrative  . Not on file    Review of Systems  Constitutional: Negative for fatigue and unexpected weight change.  Eyes: Negative for visual disturbance.  Respiratory: Negative for cough, chest tightness and shortness of breath.   Cardiovascular: Negative for chest pain, palpitations and leg swelling.  Gastrointestinal: Negative for abdominal pain and blood in stool.  Neurological: Negative for dizziness, light-headedness and headaches.       Objective:   Physical Exam Vitals signs reviewed.  Constitutional:      Appearance: He is well-developed.  HENT:     Head: Normocephalic and atraumatic.  Eyes:     Pupils: Pupils are equal, round, and reactive to light.  Neck:     Vascular: No carotid bruit or JVD.  Cardiovascular:     Rate and Rhythm: Normal rate and regular rhythm.     Heart sounds: Normal heart sounds. No murmur.  Pulmonary:     Effort: Pulmonary effort is normal.     Breath sounds: Normal breath sounds. No rales.  Skin:    General: Skin is warm and dry.  Neurological:     Mental Status: He is alert and oriented to  person, place, and time.     Vitals:   06/09/18 0806  BP: 136/78  Pulse: 67  Resp: 14  Temp: 99 F (37.2 C)  TempSrc: Oral  SpO2: 99%  Weight: 211 lb 3.2 oz (95.8 kg)  Height: 6\' 4"  (1.93 m)       Assessment & Plan:    Terry Padilla is a 58 y.o. male Essential hypertension, benign Elevated serum creatinine  Essential hypertension - Plan: amLODipine (NORVASC) 10 MG tablet  -Restart of primary care with me.  Reportedly has had recent lab work but I do not have access to that information in EHR.  Records requested.  We will continue same dose of amlodipine for now.  Followed by nephrology.  -Monitor home readings with recommendations on handout on how to check blood pressure effectively.  -Recheck 1 month, sooner if persistent elevated readings   Elevated PSA  -Reported range of around 6.  Likely will need follow-up with urology but records are pending.  He did want to delay that referral until improved insurance coverage.  Hyperlipidemia, unspecified hyperlipidemia type - Plan: rosuvastatin (CRESTOR) 10 MG tablet  -Tolerating Crestor, reports recent lab work through [facility].  Records requested, recheck 1 month    Meds ordered this encounter  Medications  . rosuvastatin (CRESTOR) 10 MG tablet    Sig: Take 1 tablet (10 mg total) by mouth daily.    Dispense:  90 tablet    Refill:  0  . amLODipine (NORVASC) 10 MG tablet    Sig: Take 1 tablet (10 mg total) by mouth daily.    Dispense:  90 tablet    Refill:  0   Patient Instructions    Keep a record of your blood pressures outside of the office and bring them to the next office visit.  Today's reading looks okay.  I would also like to look at the recent kidney tests before making any decisions on changes of medication.  I will request some lab work from your previous provider.  If PSA is elevated I would recommend follow-up with urology.  Let me know when it is okay to refer you.  Follow-up with me in 1 month to  review labs and medications further.  How to Take Your Blood Pressure Blood pressure is a measurement of how strongly your blood is pressing against the walls of your arteries. Arteries are blood vessels that carry blood from your heart throughout your body. Your health care provider takes your blood pressure at each office visit. You can also take your own blood pressure at home with a blood pressure machine. You may need to take your own blood pressure:  To confirm a diagnosis of high blood pressure (hypertension).  To monitor your blood pressure over time.  To make sure your blood pressure medicine is working. Supplies needed: To take your blood pressure, you will need a blood pressure machine. You can buy a blood pressure machine, or blood pressure monitor, at most drugstores or online. There are several types of home blood pressure monitors. When choosing one, consider the following:  Choose a monitor that has an arm cuff.  Choose a cuff that wraps snugly around your upper arm. You should be able to fit only one finger between your arm and the cuff.  Do not choose a monitor that measures your blood pressure from your wrist or finger. Your health care provider can suggest a reliable monitor that will meet your needs. How to prepare To get the most accurate reading, avoid the following for 30 minutes before you check your blood pressure:  Drinking caffeine.  Drinking alcohol.  Eating.  Smoking.  Exercising. Five minutes before you check your blood pressure:  Empty your bladder.  Sit quietly without talking in a dining chair, rather than in a soft couch or armchair. How to take your blood pressure To check your blood pressure, follow the instructions in the manual  that came with your blood pressure monitor. If you have a digital blood pressure monitor, the instructions may be as follows: 1. Sit up straight. 2. Place your feet on the floor. Do not cross your ankles or  legs. 3. Rest your left arm at the level of your heart on a table or desk or on the arm of a chair. 4. Pull up your shirt sleeve. 5. Wrap the blood pressure cuff around the upper part of your left arm, 1 inch (2.5 cm) above your elbow. It is best to wrap the cuff around bare skin. 6. Fit the cuff snugly around your arm. You should be able to place only one finger between the cuff and your arm. 7. Position the cord inside the groove of your elbow. 8. Press the power button. 9. Sit quietly while the cuff inflates and deflates. 10. Read the digital reading on the monitor screen and write it down (record it). 11. Wait 2-3 minutes, then repeat the steps, starting at step 1. What does my blood pressure reading mean? A blood pressure reading consists of a higher number over a lower number. Ideally, your blood pressure should be below 120/80. The first ("top") number is called the systolic pressure. It is a measure of the pressure in your arteries as your heart beats. The second ("bottom") number is called the diastolic pressure. It is a measure of the pressure in your arteries as the heart relaxes. Blood pressure is classified into four stages. The following are the stages for adults who do not have a short-term serious illness or a chronic condition. Systolic pressure and diastolic pressure are measured in a unit called mm Hg. Normal  Systolic pressure: below 595.  Diastolic pressure: below 80. Elevated  Systolic pressure: 638-756.  Diastolic pressure: below 80. Hypertension stage 1  Systolic pressure: 433-295.  Diastolic pressure: 18-84. Hypertension stage 2  Systolic pressure: 166 or above.  Diastolic pressure: 90 or above. You can have prehypertension or hypertension even if only the systolic or only the diastolic number in your reading is higher than normal. Follow these instructions at home:  Check your blood pressure as often as recommended by your health care provider.  Take  your monitor to the next appointment with your health care provider to make sure: ? That you are using it correctly. ? That it provides accurate readings.  Be sure you understand what your goal blood pressure numbers are.  Tell your health care provider if you are having any side effects from blood pressure medicine. Contact a health care provider if:  Your blood pressure is consistently high. Get help right away if:  Your systolic blood pressure is higher than 180.  Your diastolic blood pressure is higher than 110. This information is not intended to replace advice given to you by your health care provider. Make sure you discuss any questions you have with your health care provider. Document Released: 08/31/2015 Document Revised: 02/03/2017 Document Reviewed: 08/31/2015 Elsevier Interactive Patient Education  [employer].   If you have lab work done today you will be contacted with your lab results within the next 2 weeks.  If you have not heard from [place] then please contact us. The fastest way to get your results is to register for My Chart.   IF you received an x-ray today, you will receive an invoice from [facility]. Please contact [facility] at [phone] with questions or concerns regarding your invoice.   IF you received labwork today,  you will receive an invoice from [place]. Please contact [facility] at [phone] with questions or concerns regarding your invoice.   Our billing staff will not be able to assist you with questions regarding bills from these companies.  You will be contacted with the lab results as soon as they are available. The fastest way to get your results is to activate your My Chart account. Instructions are located on the last page of this paperwork. If you have not heard from [place] regarding the results in 2 weeks, please contact this office.       Signed,   Merri Ray, MD [facility].  06/09/18 8:42 AM

## 2018-07-09 ENCOUNTER — Telehealth: Payer: Self-pay | Admitting: Family Medicine

## 2018-11-30 DIAGNOSIS — F312 Bipolar disorder, current episode manic severe with psychotic features: Secondary | ICD-10-CM | POA: Diagnosis not present

## 2018-12-02 DIAGNOSIS — F250 Schizoaffective disorder, bipolar type: Secondary | ICD-10-CM | POA: Diagnosis not present

## 2018-12-21 DIAGNOSIS — F250 Schizoaffective disorder, bipolar type: Secondary | ICD-10-CM | POA: Diagnosis not present

## 2019-01-12 DIAGNOSIS — F250 Schizoaffective disorder, bipolar type: Secondary | ICD-10-CM | POA: Diagnosis not present

## 2019-01-18 ENCOUNTER — Other Ambulatory Visit: Payer: Self-pay | Admitting: Family Medicine

## 2019-01-18 DIAGNOSIS — I10 Essential (primary) hypertension: Secondary | ICD-10-CM

## 2019-01-18 NOTE — Telephone Encounter (Signed)
Requested medication (s) are due for refill today: yes  Requested medication (s) are on the active medication list: yes  Last refill:  06/21/2018  Future visit scheduled: no  Notes to clinic:review for refill Overdue for office visit    Requested Prescriptions  Pending Prescriptions Disp Refills   amLODipine (NORVASC) 10 MG tablet [Pharmacy Med Name: amLODIPine Besylate 10 MG Oral Tablet] 90 tablet 0    Sig: Take 1 tablet by mouth once daily     Cardiovascular:  Calcium Channel Blockers Failed - 01/18/2019 11:42 AM      Failed - Valid encounter within last 6 months    Recent Outpatient Visits          7 months ago Essential hypertension, benign   [facility], Ranell Patrick, MD   2 years ago Essential hypertension   [facility], Ranell Patrick, MD   3 years ago Elevated BP   [facility]   3 years ago Physical exam   [facility], Fenton Malling, MD   4 years ago Elevated BP   [facility], [facility]             Passed - Last BP in normal range    BP Readings from Last 1 Encounters:  06/09/18 136/78

## 2019-01-19 ENCOUNTER — Telehealth: Payer: Self-pay | Admitting: *Deleted

## 2019-01-19 NOTE — Telephone Encounter (Signed)
Spoke with pt and he states he did not ask for med refill and he is seeing another provider. Refused appt

## 2019-01-19 NOTE — Telephone Encounter (Signed)
Please schedule medication check Dr. Carlota Raspberry 30 days sent in needs office visit for any additional refills  Thanks

## 2019-01-19 NOTE — Telephone Encounter (Signed)
Please schedule for follow up Dr. Carlota Raspberry.  30 days sent in.  Patient needs to be seen for additional refills

## 2019-02-01 DIAGNOSIS — F250 Schizoaffective disorder, bipolar type: Secondary | ICD-10-CM | POA: Diagnosis not present

## 2019-02-07 DIAGNOSIS — L918 Other hypertrophic disorders of the skin: Secondary | ICD-10-CM | POA: Diagnosis not present

## 2019-02-21 DIAGNOSIS — N189 Chronic kidney disease, unspecified: Secondary | ICD-10-CM | POA: Diagnosis not present

## 2019-02-21 DIAGNOSIS — N184 Chronic kidney disease, stage 4 (severe): Secondary | ICD-10-CM | POA: Diagnosis not present

## 2019-02-22 DIAGNOSIS — F250 Schizoaffective disorder, bipolar type: Secondary | ICD-10-CM | POA: Diagnosis not present

## 2019-02-23 DIAGNOSIS — I129 Hypertensive chronic kidney disease with stage 1 through stage 4 chronic kidney disease, or unspecified chronic kidney disease: Secondary | ICD-10-CM | POA: Diagnosis not present

## 2019-02-23 DIAGNOSIS — D631 Anemia in chronic kidney disease: Secondary | ICD-10-CM | POA: Diagnosis not present

## 2019-02-23 DIAGNOSIS — N184 Chronic kidney disease, stage 4 (severe): Secondary | ICD-10-CM | POA: Diagnosis not present

## 2019-02-23 DIAGNOSIS — E1129 Type 2 diabetes mellitus with other diabetic kidney complication: Secondary | ICD-10-CM | POA: Diagnosis not present

## 2019-03-15 DIAGNOSIS — F250 Schizoaffective disorder, bipolar type: Secondary | ICD-10-CM | POA: Diagnosis not present

## 2019-04-06 DIAGNOSIS — F250 Schizoaffective disorder, bipolar type: Secondary | ICD-10-CM | POA: Diagnosis not present

## 2019-04-21 DIAGNOSIS — I10 Essential (primary) hypertension: Secondary | ICD-10-CM | POA: Diagnosis not present

## 2019-04-21 DIAGNOSIS — R972 Elevated prostate specific antigen [PSA]: Secondary | ICD-10-CM | POA: Diagnosis not present

## 2019-04-21 DIAGNOSIS — E7800 Pure hypercholesterolemia, unspecified: Secondary | ICD-10-CM | POA: Diagnosis not present

## 2019-04-26 DIAGNOSIS — F250 Schizoaffective disorder, bipolar type: Secondary | ICD-10-CM | POA: Diagnosis not present

## 2019-04-28 DIAGNOSIS — R972 Elevated prostate specific antigen [PSA]: Secondary | ICD-10-CM | POA: Diagnosis not present

## 2019-05-17 DIAGNOSIS — F250 Schizoaffective disorder, bipolar type: Secondary | ICD-10-CM | POA: Diagnosis not present

## 2019-06-07 DIAGNOSIS — F250 Schizoaffective disorder, bipolar type: Secondary | ICD-10-CM | POA: Diagnosis not present

## 2019-06-24 DIAGNOSIS — R3121 Asymptomatic microscopic hematuria: Secondary | ICD-10-CM | POA: Diagnosis not present

## 2019-06-24 DIAGNOSIS — R972 Elevated prostate specific antigen [PSA]: Secondary | ICD-10-CM | POA: Diagnosis not present

## 2019-06-27 ENCOUNTER — Other Ambulatory Visit: Payer: Self-pay | Admitting: Urology

## 2019-06-27 DIAGNOSIS — R972 Elevated prostate specific antigen [PSA]: Secondary | ICD-10-CM

## 2019-06-28 DIAGNOSIS — F250 Schizoaffective disorder, bipolar type: Secondary | ICD-10-CM | POA: Diagnosis not present

## 2019-06-29 DIAGNOSIS — N184 Chronic kidney disease, stage 4 (severe): Secondary | ICD-10-CM | POA: Diagnosis not present

## 2019-07-01 DIAGNOSIS — E1129 Type 2 diabetes mellitus with other diabetic kidney complication: Secondary | ICD-10-CM | POA: Diagnosis not present

## 2019-07-01 DIAGNOSIS — N1831 Chronic kidney disease, stage 3a: Secondary | ICD-10-CM | POA: Diagnosis not present

## 2019-07-01 DIAGNOSIS — D631 Anemia in chronic kidney disease: Secondary | ICD-10-CM | POA: Diagnosis not present

## 2019-07-01 DIAGNOSIS — I129 Hypertensive chronic kidney disease with stage 1 through stage 4 chronic kidney disease, or unspecified chronic kidney disease: Secondary | ICD-10-CM | POA: Diagnosis not present

## 2019-07-04 DIAGNOSIS — L200 Besnier's prurigo: Secondary | ICD-10-CM | POA: Diagnosis not present

## 2019-07-12 DIAGNOSIS — Z79899 Other long term (current) drug therapy: Secondary | ICD-10-CM | POA: Diagnosis not present

## 2019-07-12 DIAGNOSIS — F3110 Bipolar disorder, current episode manic without psychotic features, unspecified: Secondary | ICD-10-CM | POA: Diagnosis not present

## 2019-07-19 DIAGNOSIS — F250 Schizoaffective disorder, bipolar type: Secondary | ICD-10-CM | POA: Diagnosis not present

## 2019-07-27 ENCOUNTER — Other Ambulatory Visit: Payer: Self-pay

## 2019-07-27 ENCOUNTER — Ambulatory Visit
Admission: RE | Admit: 2019-07-27 | Discharge: 2019-07-27 | Disposition: A | Discharge status: Home / Self Care | Payer: BC Managed Care – PPO | Source: Ambulatory Visit | Attending: Urology | Admitting: Urology

## 2019-07-27 DIAGNOSIS — N419 Inflammatory disease of prostate, unspecified: Secondary | ICD-10-CM | POA: Diagnosis not present

## 2019-07-27 DIAGNOSIS — R972 Elevated prostate specific antigen [PSA]: Secondary | ICD-10-CM

## 2019-07-27 MED ORDER — GADOBENATE DIMEGLUMINE 529 MG/ML IV SOLN
20.0000 mL | Freq: Once | INTRAVENOUS | Status: AC | PRN
  Administered 2019-07-27: 20 mL via INTRAVENOUS

## 2019-08-09 DIAGNOSIS — F250 Schizoaffective disorder, bipolar type: Secondary | ICD-10-CM | POA: Diagnosis not present

## 2019-08-12 DIAGNOSIS — R972 Elevated prostate specific antigen [PSA]: Secondary | ICD-10-CM | POA: Diagnosis not present

## 2019-08-30 DIAGNOSIS — F250 Schizoaffective disorder, bipolar type: Secondary | ICD-10-CM | POA: Diagnosis not present

## 2019-09-16 DIAGNOSIS — F250 Schizoaffective disorder, bipolar type: Secondary | ICD-10-CM | POA: Diagnosis not present

## 2019-10-06 DIAGNOSIS — F250 Schizoaffective disorder, bipolar type: Secondary | ICD-10-CM | POA: Diagnosis not present

## 2019-10-19 DIAGNOSIS — I10 Essential (primary) hypertension: Secondary | ICD-10-CM | POA: Diagnosis not present

## 2019-10-19 DIAGNOSIS — E7800 Pure hypercholesterolemia, unspecified: Secondary | ICD-10-CM | POA: Diagnosis not present

## 2019-10-25 DIAGNOSIS — N1831 Chronic kidney disease, stage 3a: Secondary | ICD-10-CM | POA: Diagnosis not present

## 2019-10-28 DIAGNOSIS — N1831 Chronic kidney disease, stage 3a: Secondary | ICD-10-CM | POA: Diagnosis not present

## 2019-10-28 DIAGNOSIS — F250 Schizoaffective disorder, bipolar type: Secondary | ICD-10-CM | POA: Diagnosis not present

## 2019-10-28 DIAGNOSIS — I129 Hypertensive chronic kidney disease with stage 1 through stage 4 chronic kidney disease, or unspecified chronic kidney disease: Secondary | ICD-10-CM | POA: Diagnosis not present

## 2019-10-28 DIAGNOSIS — F3189 Other bipolar disorder: Secondary | ICD-10-CM | POA: Diagnosis not present

## 2019-11-21 DIAGNOSIS — F250 Schizoaffective disorder, bipolar type: Secondary | ICD-10-CM | POA: Diagnosis not present

## 2019-12-16 DIAGNOSIS — F250 Schizoaffective disorder, bipolar type: Secondary | ICD-10-CM | POA: Diagnosis not present

## 2020-01-03 DIAGNOSIS — F250 Schizoaffective disorder, bipolar type: Secondary | ICD-10-CM | POA: Diagnosis not present

## 2020-01-24 DIAGNOSIS — F250 Schizoaffective disorder, bipolar type: Secondary | ICD-10-CM | POA: Diagnosis not present

## 2020-02-03 DIAGNOSIS — R972 Elevated prostate specific antigen [PSA]: Secondary | ICD-10-CM | POA: Diagnosis not present

## 2020-02-14 DIAGNOSIS — F250 Schizoaffective disorder, bipolar type: Secondary | ICD-10-CM | POA: Diagnosis not present

## 2020-03-06 DIAGNOSIS — F250 Schizoaffective disorder, bipolar type: Secondary | ICD-10-CM | POA: Diagnosis not present

## 2020-03-27 DIAGNOSIS — F250 Schizoaffective disorder, bipolar type: Secondary | ICD-10-CM | POA: Diagnosis not present

## 2021-02-11 DIAGNOSIS — N1831 Chronic kidney disease, stage 3a: Secondary | ICD-10-CM | POA: Diagnosis not present

## 2021-02-14 DIAGNOSIS — E1129 Type 2 diabetes mellitus with other diabetic kidney complication: Secondary | ICD-10-CM | POA: Diagnosis not present

## 2021-02-14 DIAGNOSIS — I129 Hypertensive chronic kidney disease with stage 1 through stage 4 chronic kidney disease, or unspecified chronic kidney disease: Secondary | ICD-10-CM | POA: Diagnosis not present

## 2021-02-14 DIAGNOSIS — N1832 Chronic kidney disease, stage 3b: Secondary | ICD-10-CM | POA: Diagnosis not present

## 2021-02-14 DIAGNOSIS — D631 Anemia in chronic kidney disease: Secondary | ICD-10-CM | POA: Diagnosis not present

## 2021-02-19 DIAGNOSIS — F250 Schizoaffective disorder, bipolar type: Secondary | ICD-10-CM | POA: Diagnosis not present

## 2021-02-26 DIAGNOSIS — N1831 Chronic kidney disease, stage 3a: Secondary | ICD-10-CM | POA: Diagnosis not present

## 2021-03-01 DIAGNOSIS — I129 Hypertensive chronic kidney disease with stage 1 through stage 4 chronic kidney disease, or unspecified chronic kidney disease: Secondary | ICD-10-CM | POA: Diagnosis not present

## 2021-03-09 DIAGNOSIS — Z03818 Encounter for observation for suspected exposure to other biological agents ruled out: Secondary | ICD-10-CM | POA: Diagnosis not present

## 2021-03-09 DIAGNOSIS — Z20822 Contact with and (suspected) exposure to covid-19: Secondary | ICD-10-CM | POA: Diagnosis not present

## 2021-03-09 DIAGNOSIS — J029 Acute pharyngitis, unspecified: Secondary | ICD-10-CM | POA: Diagnosis not present

## 2021-03-12 DIAGNOSIS — F250 Schizoaffective disorder, bipolar type: Secondary | ICD-10-CM | POA: Diagnosis not present

## 2021-04-03 DIAGNOSIS — F250 Schizoaffective disorder, bipolar type: Secondary | ICD-10-CM | POA: Diagnosis not present

## 2021-04-10 DIAGNOSIS — Z23 Encounter for immunization: Secondary | ICD-10-CM | POA: Diagnosis not present

## 2021-04-23 DIAGNOSIS — F250 Schizoaffective disorder, bipolar type: Secondary | ICD-10-CM | POA: Diagnosis not present

## 2021-05-07 DIAGNOSIS — Z79899 Other long term (current) drug therapy: Secondary | ICD-10-CM | POA: Diagnosis not present

## 2021-05-07 DIAGNOSIS — F250 Schizoaffective disorder, bipolar type: Secondary | ICD-10-CM | POA: Diagnosis not present

## 2021-05-08 DIAGNOSIS — R972 Elevated prostate specific antigen [PSA]: Secondary | ICD-10-CM | POA: Diagnosis not present

## 2021-05-09 DIAGNOSIS — N1831 Chronic kidney disease, stage 3a: Secondary | ICD-10-CM | POA: Diagnosis not present

## 2021-05-13 DIAGNOSIS — F250 Schizoaffective disorder, bipolar type: Secondary | ICD-10-CM | POA: Diagnosis not present

## 2021-05-14 DIAGNOSIS — I129 Hypertensive chronic kidney disease with stage 1 through stage 4 chronic kidney disease, or unspecified chronic kidney disease: Secondary | ICD-10-CM | POA: Diagnosis not present

## 2021-05-14 DIAGNOSIS — N1832 Chronic kidney disease, stage 3b: Secondary | ICD-10-CM | POA: Diagnosis not present

## 2021-05-14 DIAGNOSIS — D631 Anemia in chronic kidney disease: Secondary | ICD-10-CM | POA: Diagnosis not present

## 2021-05-14 DIAGNOSIS — R809 Proteinuria, unspecified: Secondary | ICD-10-CM | POA: Diagnosis not present

## 2021-05-15 DIAGNOSIS — N401 Enlarged prostate with lower urinary tract symptoms: Secondary | ICD-10-CM | POA: Diagnosis not present

## 2021-05-15 DIAGNOSIS — R3121 Asymptomatic microscopic hematuria: Secondary | ICD-10-CM | POA: Diagnosis not present

## 2021-05-15 DIAGNOSIS — R351 Nocturia: Secondary | ICD-10-CM | POA: Diagnosis not present

## 2021-05-15 DIAGNOSIS — R972 Elevated prostate specific antigen [PSA]: Secondary | ICD-10-CM | POA: Diagnosis not present

## 2021-05-30 DIAGNOSIS — N1831 Chronic kidney disease, stage 3a: Secondary | ICD-10-CM | POA: Diagnosis not present

## 2021-06-04 DIAGNOSIS — F250 Schizoaffective disorder, bipolar type: Secondary | ICD-10-CM | POA: Diagnosis not present

## 2021-06-25 DIAGNOSIS — N184 Chronic kidney disease, stage 4 (severe): Secondary | ICD-10-CM | POA: Diagnosis not present

## 2021-06-25 DIAGNOSIS — F312 Bipolar disorder, current episode manic severe with psychotic features: Secondary | ICD-10-CM | POA: Diagnosis not present

## 2021-06-25 DIAGNOSIS — I10 Essential (primary) hypertension: Secondary | ICD-10-CM | POA: Diagnosis not present

## 2021-07-03 DIAGNOSIS — I129 Hypertensive chronic kidney disease with stage 1 through stage 4 chronic kidney disease, or unspecified chronic kidney disease: Secondary | ICD-10-CM | POA: Diagnosis not present

## 2021-07-03 DIAGNOSIS — D631 Anemia in chronic kidney disease: Secondary | ICD-10-CM | POA: Diagnosis not present

## 2021-07-03 DIAGNOSIS — R809 Proteinuria, unspecified: Secondary | ICD-10-CM | POA: Diagnosis not present

## 2021-07-03 DIAGNOSIS — N1832 Chronic kidney disease, stage 3b: Secondary | ICD-10-CM | POA: Diagnosis not present

## 2021-07-16 DIAGNOSIS — F250 Schizoaffective disorder, bipolar type: Secondary | ICD-10-CM | POA: Diagnosis not present

## 2021-07-17 DIAGNOSIS — I129 Hypertensive chronic kidney disease with stage 1 through stage 4 chronic kidney disease, or unspecified chronic kidney disease: Secondary | ICD-10-CM | POA: Diagnosis not present

## 2021-08-06 DIAGNOSIS — F250 Schizoaffective disorder, bipolar type: Secondary | ICD-10-CM | POA: Diagnosis not present

## 2021-08-27 DIAGNOSIS — F250 Schizoaffective disorder, bipolar type: Secondary | ICD-10-CM | POA: Diagnosis not present

## 2021-09-17 DIAGNOSIS — F250 Schizoaffective disorder, bipolar type: Secondary | ICD-10-CM | POA: Diagnosis not present

## 2021-09-26 DIAGNOSIS — N1831 Chronic kidney disease, stage 3a: Secondary | ICD-10-CM | POA: Diagnosis not present

## 2021-10-02 DIAGNOSIS — D631 Anemia in chronic kidney disease: Secondary | ICD-10-CM | POA: Diagnosis not present

## 2021-10-02 DIAGNOSIS — R319 Hematuria, unspecified: Secondary | ICD-10-CM | POA: Diagnosis not present

## 2021-10-02 DIAGNOSIS — N1832 Chronic kidney disease, stage 3b: Secondary | ICD-10-CM | POA: Diagnosis not present

## 2021-10-02 DIAGNOSIS — I129 Hypertensive chronic kidney disease with stage 1 through stage 4 chronic kidney disease, or unspecified chronic kidney disease: Secondary | ICD-10-CM | POA: Diagnosis not present

## 2021-10-09 DIAGNOSIS — F250 Schizoaffective disorder, bipolar type: Secondary | ICD-10-CM | POA: Diagnosis not present

## 2021-10-16 DIAGNOSIS — Z7189 Other specified counseling: Secondary | ICD-10-CM | POA: Diagnosis not present

## 2021-10-29 DIAGNOSIS — F250 Schizoaffective disorder, bipolar type: Secondary | ICD-10-CM | POA: Diagnosis not present

## 2021-10-29 DIAGNOSIS — Z79899 Other long term (current) drug therapy: Secondary | ICD-10-CM | POA: Diagnosis not present

## 2021-11-22 DIAGNOSIS — F250 Schizoaffective disorder, bipolar type: Secondary | ICD-10-CM | POA: Diagnosis not present

## 2021-12-10 DIAGNOSIS — F250 Schizoaffective disorder, bipolar type: Secondary | ICD-10-CM | POA: Diagnosis not present

## 2021-12-31 DIAGNOSIS — F250 Schizoaffective disorder, bipolar type: Secondary | ICD-10-CM | POA: Diagnosis not present

## 2022-01-20 DIAGNOSIS — I10 Essential (primary) hypertension: Secondary | ICD-10-CM | POA: Diagnosis not present

## 2022-01-20 DIAGNOSIS — Z23 Encounter for immunization: Secondary | ICD-10-CM | POA: Diagnosis not present

## 2022-01-20 DIAGNOSIS — E7800 Pure hypercholesterolemia, unspecified: Secondary | ICD-10-CM | POA: Diagnosis not present

## 2022-01-20 DIAGNOSIS — Z0000 Encounter for general adult medical examination without abnormal findings: Secondary | ICD-10-CM | POA: Diagnosis not present

## 2022-01-22 DIAGNOSIS — F250 Schizoaffective disorder, bipolar type: Secondary | ICD-10-CM | POA: Diagnosis not present

## 2022-01-27 DIAGNOSIS — E7800 Pure hypercholesterolemia, unspecified: Secondary | ICD-10-CM | POA: Diagnosis not present

## 2022-01-27 DIAGNOSIS — N1831 Chronic kidney disease, stage 3a: Secondary | ICD-10-CM | POA: Diagnosis not present

## 2022-01-30 DIAGNOSIS — I129 Hypertensive chronic kidney disease with stage 1 through stage 4 chronic kidney disease, or unspecified chronic kidney disease: Secondary | ICD-10-CM | POA: Diagnosis not present

## 2022-01-30 DIAGNOSIS — N1832 Chronic kidney disease, stage 3b: Secondary | ICD-10-CM | POA: Diagnosis not present

## 2022-01-30 DIAGNOSIS — R319 Hematuria, unspecified: Secondary | ICD-10-CM | POA: Diagnosis not present

## 2022-01-30 DIAGNOSIS — D631 Anemia in chronic kidney disease: Secondary | ICD-10-CM | POA: Diagnosis not present

## 2022-02-11 DIAGNOSIS — F250 Schizoaffective disorder, bipolar type: Secondary | ICD-10-CM | POA: Diagnosis not present

## 2022-03-25 DIAGNOSIS — F250 Schizoaffective disorder, bipolar type: Secondary | ICD-10-CM | POA: Diagnosis not present

## 2022-04-15 DIAGNOSIS — F250 Schizoaffective disorder, bipolar type: Secondary | ICD-10-CM | POA: Diagnosis not present

## 2022-04-24 DIAGNOSIS — F312 Bipolar disorder, current episode manic severe with psychotic features: Secondary | ICD-10-CM | POA: Diagnosis not present

## 2022-05-06 DIAGNOSIS — F250 Schizoaffective disorder, bipolar type: Secondary | ICD-10-CM | POA: Diagnosis not present

## 2022-05-12 DIAGNOSIS — I10 Essential (primary) hypertension: Secondary | ICD-10-CM | POA: Diagnosis not present

## 2022-05-16 DIAGNOSIS — N1831 Chronic kidney disease, stage 3a: Secondary | ICD-10-CM | POA: Diagnosis not present

## 2022-05-20 DIAGNOSIS — N1832 Chronic kidney disease, stage 3b: Secondary | ICD-10-CM | POA: Diagnosis not present

## 2022-05-20 DIAGNOSIS — R319 Hematuria, unspecified: Secondary | ICD-10-CM | POA: Diagnosis not present

## 2022-05-20 DIAGNOSIS — D631 Anemia in chronic kidney disease: Secondary | ICD-10-CM | POA: Diagnosis not present

## 2022-05-20 DIAGNOSIS — I129 Hypertensive chronic kidney disease with stage 1 through stage 4 chronic kidney disease, or unspecified chronic kidney disease: Secondary | ICD-10-CM | POA: Diagnosis not present

## 2022-05-27 DIAGNOSIS — F250 Schizoaffective disorder, bipolar type: Secondary | ICD-10-CM | POA: Diagnosis not present

## 2022-06-04 DIAGNOSIS — N5201 Erectile dysfunction due to arterial insufficiency: Secondary | ICD-10-CM | POA: Diagnosis not present

## 2022-06-04 DIAGNOSIS — R351 Nocturia: Secondary | ICD-10-CM | POA: Diagnosis not present

## 2022-06-04 DIAGNOSIS — N401 Enlarged prostate with lower urinary tract symptoms: Secondary | ICD-10-CM | POA: Diagnosis not present

## 2022-06-04 DIAGNOSIS — R972 Elevated prostate specific antigen [PSA]: Secondary | ICD-10-CM | POA: Diagnosis not present

## 2022-06-10 DIAGNOSIS — I129 Hypertensive chronic kidney disease with stage 1 through stage 4 chronic kidney disease, or unspecified chronic kidney disease: Secondary | ICD-10-CM | POA: Diagnosis not present

## 2022-06-17 DIAGNOSIS — F250 Schizoaffective disorder, bipolar type: Secondary | ICD-10-CM | POA: Diagnosis not present

## 2022-07-10 ENCOUNTER — Other Ambulatory Visit: Payer: Self-pay

## 2022-07-10 ENCOUNTER — Encounter (HOSPITAL_BASED_OUTPATIENT_CLINIC_OR_DEPARTMENT_OTHER): Payer: Self-pay | Admitting: Emergency Medicine

## 2022-07-10 ENCOUNTER — Emergency Department (HOSPITAL_BASED_OUTPATIENT_CLINIC_OR_DEPARTMENT_OTHER)
Admission: EM | Admit: 2022-07-10 | Discharge: 2022-07-10 | Disposition: A | Discharge status: Home / Self Care | Payer: BC Managed Care – PPO | Attending: Emergency Medicine | Admitting: Emergency Medicine

## 2022-07-10 ENCOUNTER — Emergency Department (HOSPITAL_BASED_OUTPATIENT_CLINIC_OR_DEPARTMENT_OTHER): Payer: BC Managed Care – PPO

## 2022-07-10 DIAGNOSIS — Z79899 Other long term (current) drug therapy: Secondary | ICD-10-CM | POA: Diagnosis not present

## 2022-07-10 DIAGNOSIS — R0789 Other chest pain: Secondary | ICD-10-CM | POA: Diagnosis not present

## 2022-07-10 DIAGNOSIS — R944 Abnormal results of kidney function studies: Secondary | ICD-10-CM | POA: Insufficient documentation

## 2022-07-10 DIAGNOSIS — R079 Chest pain, unspecified: Secondary | ICD-10-CM | POA: Diagnosis not present

## 2022-07-10 DIAGNOSIS — N189 Chronic kidney disease, unspecified: Secondary | ICD-10-CM | POA: Diagnosis not present

## 2022-07-10 DIAGNOSIS — I129 Hypertensive chronic kidney disease with stage 1 through stage 4 chronic kidney disease, or unspecified chronic kidney disease: Secondary | ICD-10-CM | POA: Diagnosis not present

## 2022-07-10 LAB — CBC
HCT: 40.7 % (ref 39.0–52.0)
Hemoglobin: 13.4 g/dL (ref 13.0–17.0)
MCH: 29.9 pg (ref 26.0–34.0)
MCHC: 32.9 g/dL (ref 30.0–36.0)
MCV: 90.8 fL (ref 80.0–100.0)
Platelets: 166 10*3/uL (ref 150–400)
RBC: 4.48 MIL/uL (ref 4.22–5.81)
RDW: 12.2 % (ref 11.5–15.5)
WBC: 6 10*3/uL (ref 4.0–10.5)
nRBC: 0 % (ref 0.0–0.2)

## 2022-07-10 LAB — BASIC METABOLIC PANEL
Anion gap: 10 (ref 5–15)
BUN: 30 mg/dL — ABNORMAL HIGH (ref 8–23)
CO2: 24 mmol/L (ref 22–32)
Calcium: 9 mg/dL (ref 8.9–10.3)
Chloride: 104 mmol/L (ref 98–111)
Creatinine, Ser: 2.48 mg/dL — ABNORMAL HIGH (ref 0.61–1.24)
GFR, Estimated: 29 mL/min — ABNORMAL LOW (ref >60)
Glucose, Bld: 116 mg/dL — ABNORMAL HIGH (ref 70–99)
Potassium: 3.8 mmol/L (ref 3.5–5.1)
Sodium: 138 mmol/L (ref 135–145)

## 2022-07-10 LAB — TROPONIN I (HIGH SENSITIVITY): Troponin I (High Sensitivity): 6 ng/L (ref <18)

## 2022-07-10 NOTE — ED Provider Notes (Signed)
[facility] [place]  Provider Note  CSN: [id number] Arrival date & time: 07/10/22 0205  History Chief Complaint  Patient presents with   Chest Pain    Terry Padilla is a 62 y.o. male with history of HTN, CKD reports onset of sharp, sternal pain earlier in the day on 4/3, worse with stretching and moving arms. He has a history of a bronchogenic cyst discovered in 2009 but lost to follow up after initial discussions with Pulmnology. He denies any cough fever or SOB. Otherwise he feels well.    Home Medications Prior to Admission medications   Medication Sig Start Date End Date Taking? Authorizing Provider  amLODipine (NORVASC) 10 MG tablet Take 1 tablet by mouth once daily 01/19/19   Wendie Agreste, MD  fluPHENAZine decanoate (PROLIXIN) 25 MG/ML injection Inject 12.5 mg into the muscle every 21 ( twenty-one) days.     [provider]  Multiple Vitamins-Minerals (MULTIVITAMIN WITH MINERALS) tablet Take 1 tablet by mouth daily.    [provider]  Oxcarbazepine (TRILEPTAL) 300 MG tablet Take 300 mg by mouth 2 (two) times daily.    [provider]  PSYLLIUM HUSK PO Take 1 scoop by mouth daily.    [provider]  rosuvastatin (CRESTOR) 10 MG tablet Take 1 tablet (10 mg total) by mouth daily. 06/09/18   Wendie Agreste, MD     Allergies    Patient has no known allergies.   Review of Systems   Review of Systems Please see HPI for pertinent positives and negatives  Physical Exam BP (!) 138/94   Pulse 84   Temp 98.7 F (37.1 C) (Oral)   Resp 18   Ht 6\' 4"  (1.93 m)   Wt 99.8 kg   SpO2 97%   BMI 26.78 kg/m   Physical Exam Vitals and nursing note reviewed.  Constitutional:      Appearance: Normal appearance.  HENT:     Head: Normocephalic and atraumatic.     Nose: Nose normal.     Mouth/Throat:     Mouth: Mucous membranes are moist.  Eyes:     Extraocular Movements: Extraocular movements intact.      Conjunctiva/sclera: Conjunctivae normal.  Cardiovascular:     Rate and Rhythm: Normal rate.  Pulmonary:     Effort: Pulmonary effort is normal.     Breath sounds: Normal breath sounds.  Chest:     Chest wall: Tenderness (focal tenderness at the sternomanubrial joint) present.  Abdominal:     General: Abdomen is flat.     Palpations: Abdomen is soft.     Tenderness: There is no abdominal tenderness.  Musculoskeletal:        General: No swelling. Normal range of motion.     Cervical back: Neck supple.  Skin:    General: Skin is warm and dry.  Neurological:     General: No focal deficit present.     Mental Status: He is alert.  Psychiatric:        Mood and Affect: Mood normal.     ED Results / Procedures / Treatments   EKG EKG Interpretation  Date/Time:  Thursday July 10 2022 02:14:21 EDT Ventricular Rate:  78 PR Interval:  203 QRS Duration: 99 QT Interval:  363 QTC Calculation: 414 R Axis:   29 Text Interpretation: Sinus rhythm Abnormal R-wave progression, early transition No old tracing to compare Confirmed by Calvert Cantor [phone]) on 07/10/2022 3:50:16 AM  Procedures Procedures  Medications Ordered in the ED Medications - No data to display  Initial Impression and Plan  Patient here with atypical, reproducible chest wall pain. Labs done in triage show unremarkable CBC, BMP with elevated creatinine, no recent values in our system to compare but patient states this is his baseline. Trop is neg. Given duration of symptoms and reproducible nature, repeat is not necessary to rule out ACS. I personally viewed the images from radiology studies and agree with radiologist interpretation: CXR shows unchanged cyst, otherwise normal. Patient advised to avoid NSAIDs for pain due to his CKD. APAP for discomfort. Follow up with PCP for re-assessment if not improving. RTED for any other concerns.   ED Course       MDM Rules/Calculators/A&P Medical Decision Making Problems  Addressed: Chest wall pain: acute illness or injury  Amount and/or Complexity of Data Reviewed Labs: ordered. Decision-making details documented in ED Course. Radiology: ordered and independent interpretation performed. Decision-making details documented in ED Course. ECG/medicine tests: ordered and independent interpretation performed. Decision-making details documented in ED Course.  Risk OTC drugs.     Final Clinical Impression(s) / ED Diagnoses Final diagnoses:  Chest wall pain    Rx / DC Orders ED Discharge Orders     None        Truddie Hidden, MD 07/10/22 [phone]

## 2022-07-10 NOTE — ED Notes (Signed)
Patient requesting to speak to MD prior to d/c

## 2022-07-10 NOTE — ED Triage Notes (Signed)
Upper mid chest pain since yesterday afternoon, denies injury nothing makes it better or worse.

## 2022-07-15 DIAGNOSIS — F250 Schizoaffective disorder, bipolar type: Secondary | ICD-10-CM | POA: Diagnosis not present

## 2022-07-21 DIAGNOSIS — D234 Other benign neoplasm of skin of scalp and neck: Secondary | ICD-10-CM | POA: Diagnosis not present

## 2022-09-09 DIAGNOSIS — N1831 Chronic kidney disease, stage 3a: Secondary | ICD-10-CM | POA: Diagnosis not present

## 2022-09-15 DIAGNOSIS — E559 Vitamin D deficiency, unspecified: Secondary | ICD-10-CM | POA: Diagnosis not present

## 2022-09-15 DIAGNOSIS — R319 Hematuria, unspecified: Secondary | ICD-10-CM | POA: Diagnosis not present

## 2022-09-15 DIAGNOSIS — N1832 Chronic kidney disease, stage 3b: Secondary | ICD-10-CM | POA: Diagnosis not present

## 2022-09-15 DIAGNOSIS — D631 Anemia in chronic kidney disease: Secondary | ICD-10-CM | POA: Diagnosis not present

## 2022-09-15 DIAGNOSIS — E7800 Pure hypercholesterolemia, unspecified: Secondary | ICD-10-CM | POA: Diagnosis not present

## 2022-09-15 DIAGNOSIS — I129 Hypertensive chronic kidney disease with stage 1 through stage 4 chronic kidney disease, or unspecified chronic kidney disease: Secondary | ICD-10-CM | POA: Diagnosis not present

## 2022-09-15 DIAGNOSIS — F319 Bipolar disorder, unspecified: Secondary | ICD-10-CM | POA: Diagnosis not present

## 2022-10-13 DIAGNOSIS — F250 Schizoaffective disorder, bipolar type: Secondary | ICD-10-CM | POA: Diagnosis not present

## 2022-10-21 DIAGNOSIS — F250 Schizoaffective disorder, bipolar type: Secondary | ICD-10-CM | POA: Diagnosis not present

## 2022-10-22 DIAGNOSIS — N368 Other specified disorders of urethra: Secondary | ICD-10-CM | POA: Diagnosis not present

## 2022-10-22 DIAGNOSIS — R0789 Other chest pain: Secondary | ICD-10-CM | POA: Diagnosis not present

## 2022-10-23 DIAGNOSIS — R310 Gross hematuria: Secondary | ICD-10-CM | POA: Diagnosis not present

## 2022-10-31 DIAGNOSIS — F250 Schizoaffective disorder, bipolar type: Secondary | ICD-10-CM | POA: Diagnosis not present

## 2022-11-05 ENCOUNTER — Other Ambulatory Visit (HOSPITAL_BASED_OUTPATIENT_CLINIC_OR_DEPARTMENT_OTHER): Payer: Self-pay

## 2022-11-05 DIAGNOSIS — K429 Umbilical hernia without obstruction or gangrene: Secondary | ICD-10-CM | POA: Diagnosis not present

## 2022-11-05 DIAGNOSIS — R310 Gross hematuria: Secondary | ICD-10-CM | POA: Diagnosis not present

## 2022-11-05 DIAGNOSIS — N400 Enlarged prostate without lower urinary tract symptoms: Secondary | ICD-10-CM | POA: Diagnosis not present

## 2022-11-05 DIAGNOSIS — K769 Liver disease, unspecified: Secondary | ICD-10-CM | POA: Diagnosis not present

## 2022-11-05 MED ORDER — LISINOPRIL 20 MG PO TABS
20.0000 mg | ORAL_TABLET | Freq: Every day | ORAL | 2 refills | Status: AC
  Filled 2022-11-28: qty 90, 90d supply, fill #0

## 2022-11-05 MED ORDER — ROSUVASTATIN CALCIUM 10 MG PO TABS
10.0000 mg | ORAL_TABLET | Freq: Every day | ORAL | 2 refills | Status: DC
  Filled 2022-11-09: qty 90, 90d supply, fill #0

## 2022-11-05 MED ORDER — METOPROLOL SUCCINATE ER 50 MG PO TB24
50.0000 mg | ORAL_TABLET | Freq: Every day | ORAL | 11 refills | Status: AC
  Filled 2023-01-26: qty 90, 90d supply, fill #0

## 2022-11-05 MED ORDER — OXCARBAZEPINE 300 MG PO TABS
300.0000 mg | ORAL_TABLET | Freq: Two times a day (BID) | ORAL | 2 refills | Status: DC
  Filled 2022-11-09: qty 120, 60d supply, fill #0

## 2022-11-06 ENCOUNTER — Other Ambulatory Visit (HOSPITAL_BASED_OUTPATIENT_CLINIC_OR_DEPARTMENT_OTHER): Payer: Self-pay

## 2022-11-06 MED ORDER — FLUPHENAZINE DECANOATE 25 MG/ML IJ SOLN
12.5000 mg | INTRAMUSCULAR | 5 refills | Status: DC
  Filled 2022-11-06 – 2022-12-22 (×3): qty 5, 90d supply, fill #0
  Filled 2022-12-31 – 2023-01-02 (×2): qty 5, 140d supply, fill #0
  Filled 2023-01-02: qty 5, 90d supply, fill #0

## 2022-11-09 ENCOUNTER — Other Ambulatory Visit (HOSPITAL_BASED_OUTPATIENT_CLINIC_OR_DEPARTMENT_OTHER): Payer: Self-pay

## 2022-11-10 ENCOUNTER — Other Ambulatory Visit (HOSPITAL_BASED_OUTPATIENT_CLINIC_OR_DEPARTMENT_OTHER): Payer: Self-pay

## 2022-11-14 ENCOUNTER — Other Ambulatory Visit: Payer: Self-pay | Admitting: Nurse Practitioner

## 2022-11-14 DIAGNOSIS — N281 Cyst of kidney, acquired: Secondary | ICD-10-CM

## 2022-11-14 DIAGNOSIS — R310 Gross hematuria: Secondary | ICD-10-CM

## 2022-11-24 ENCOUNTER — Other Ambulatory Visit (HOSPITAL_BASED_OUTPATIENT_CLINIC_OR_DEPARTMENT_OTHER): Payer: Self-pay

## 2022-11-24 ENCOUNTER — Other Ambulatory Visit (HOSPITAL_COMMUNITY): Payer: Self-pay

## 2022-11-24 MED ORDER — AMLODIPINE BESYLATE 10 MG PO TABS
10.0000 mg | ORAL_TABLET | Freq: Every day | ORAL | 0 refills | Status: DC
  Filled 2022-11-24 – 2022-12-21 (×5): qty 90, 90d supply, fill #0

## 2022-11-25 ENCOUNTER — Other Ambulatory Visit (HOSPITAL_BASED_OUTPATIENT_CLINIC_OR_DEPARTMENT_OTHER): Payer: Self-pay

## 2022-11-27 ENCOUNTER — Encounter: Payer: Self-pay | Admitting: Nurse Practitioner

## 2022-11-28 ENCOUNTER — Other Ambulatory Visit: Payer: Self-pay

## 2022-11-28 ENCOUNTER — Other Ambulatory Visit (HOSPITAL_BASED_OUTPATIENT_CLINIC_OR_DEPARTMENT_OTHER): Payer: Self-pay

## 2022-12-01 ENCOUNTER — Other Ambulatory Visit (HOSPITAL_BASED_OUTPATIENT_CLINIC_OR_DEPARTMENT_OTHER): Payer: Self-pay

## 2022-12-02 ENCOUNTER — Ambulatory Visit
Admission: RE | Admit: 2022-12-02 | Discharge: 2022-12-02 | Disposition: A | Discharge status: Home / Self Care | Payer: Commercial Managed Care - PPO | Source: Ambulatory Visit | Attending: Nurse Practitioner | Admitting: Nurse Practitioner

## 2022-12-02 DIAGNOSIS — N281 Cyst of kidney, acquired: Secondary | ICD-10-CM

## 2022-12-02 DIAGNOSIS — R310 Gross hematuria: Secondary | ICD-10-CM

## 2022-12-02 MED ORDER — GADOPICLENOL 0.5 MMOL/ML IV SOLN
10.0000 mL | Freq: Once | INTRAVENOUS | Status: AC | PRN
  Administered 2022-12-02: 10 mL via INTRAVENOUS

## 2022-12-03 ENCOUNTER — Other Ambulatory Visit (HOSPITAL_BASED_OUTPATIENT_CLINIC_OR_DEPARTMENT_OTHER): Payer: Self-pay

## 2022-12-16 DIAGNOSIS — F250 Schizoaffective disorder, bipolar type: Secondary | ICD-10-CM | POA: Diagnosis not present

## 2022-12-22 ENCOUNTER — Other Ambulatory Visit (HOSPITAL_BASED_OUTPATIENT_CLINIC_OR_DEPARTMENT_OTHER): Payer: Self-pay

## 2022-12-23 ENCOUNTER — Other Ambulatory Visit (HOSPITAL_BASED_OUTPATIENT_CLINIC_OR_DEPARTMENT_OTHER): Payer: Self-pay

## 2022-12-24 ENCOUNTER — Other Ambulatory Visit: Payer: Self-pay

## 2022-12-25 ENCOUNTER — Other Ambulatory Visit: Payer: Self-pay

## 2022-12-25 ENCOUNTER — Other Ambulatory Visit (HOSPITAL_BASED_OUTPATIENT_CLINIC_OR_DEPARTMENT_OTHER): Payer: Self-pay

## 2022-12-27 ENCOUNTER — Encounter (HOSPITAL_COMMUNITY): Payer: Self-pay

## 2022-12-31 ENCOUNTER — Other Ambulatory Visit (HOSPITAL_BASED_OUTPATIENT_CLINIC_OR_DEPARTMENT_OTHER): Payer: Self-pay

## 2023-01-01 ENCOUNTER — Other Ambulatory Visit: Payer: Self-pay

## 2023-01-01 ENCOUNTER — Other Ambulatory Visit (HOSPITAL_BASED_OUTPATIENT_CLINIC_OR_DEPARTMENT_OTHER): Payer: Self-pay

## 2023-01-02 ENCOUNTER — Other Ambulatory Visit (HOSPITAL_BASED_OUTPATIENT_CLINIC_OR_DEPARTMENT_OTHER): Payer: Self-pay

## 2023-01-02 ENCOUNTER — Other Ambulatory Visit: Payer: Self-pay

## 2023-01-05 ENCOUNTER — Other Ambulatory Visit: Payer: Self-pay

## 2023-01-06 ENCOUNTER — Other Ambulatory Visit: Payer: Self-pay

## 2023-01-06 DIAGNOSIS — F250 Schizoaffective disorder, bipolar type: Secondary | ICD-10-CM | POA: Diagnosis not present

## 2023-01-07 ENCOUNTER — Other Ambulatory Visit: Payer: Self-pay

## 2023-01-07 DIAGNOSIS — N401 Enlarged prostate with lower urinary tract symptoms: Secondary | ICD-10-CM | POA: Diagnosis not present

## 2023-01-07 DIAGNOSIS — R351 Nocturia: Secondary | ICD-10-CM | POA: Diagnosis not present

## 2023-01-09 ENCOUNTER — Other Ambulatory Visit: Payer: Self-pay

## 2023-01-12 ENCOUNTER — Other Ambulatory Visit (HOSPITAL_BASED_OUTPATIENT_CLINIC_OR_DEPARTMENT_OTHER): Payer: Self-pay

## 2023-01-12 DIAGNOSIS — N1831 Chronic kidney disease, stage 3a: Secondary | ICD-10-CM | POA: Diagnosis not present

## 2023-01-13 ENCOUNTER — Other Ambulatory Visit (HOSPITAL_BASED_OUTPATIENT_CLINIC_OR_DEPARTMENT_OTHER): Payer: Self-pay

## 2023-01-13 ENCOUNTER — Other Ambulatory Visit: Payer: Self-pay

## 2023-01-14 ENCOUNTER — Other Ambulatory Visit: Payer: Self-pay

## 2023-01-14 DIAGNOSIS — R972 Elevated prostate specific antigen [PSA]: Secondary | ICD-10-CM | POA: Diagnosis not present

## 2023-01-14 DIAGNOSIS — R351 Nocturia: Secondary | ICD-10-CM | POA: Diagnosis not present

## 2023-01-14 DIAGNOSIS — N401 Enlarged prostate with lower urinary tract symptoms: Secondary | ICD-10-CM | POA: Diagnosis not present

## 2023-01-14 DIAGNOSIS — R310 Gross hematuria: Secondary | ICD-10-CM | POA: Diagnosis not present

## 2023-01-15 ENCOUNTER — Other Ambulatory Visit: Payer: Self-pay

## 2023-01-16 ENCOUNTER — Other Ambulatory Visit: Payer: Self-pay

## 2023-01-16 DIAGNOSIS — F319 Bipolar disorder, unspecified: Secondary | ICD-10-CM | POA: Diagnosis not present

## 2023-01-16 DIAGNOSIS — N1832 Chronic kidney disease, stage 3b: Secondary | ICD-10-CM | POA: Diagnosis not present

## 2023-01-16 DIAGNOSIS — I129 Hypertensive chronic kidney disease with stage 1 through stage 4 chronic kidney disease, or unspecified chronic kidney disease: Secondary | ICD-10-CM | POA: Diagnosis not present

## 2023-01-16 DIAGNOSIS — E559 Vitamin D deficiency, unspecified: Secondary | ICD-10-CM | POA: Diagnosis not present

## 2023-01-16 DIAGNOSIS — R319 Hematuria, unspecified: Secondary | ICD-10-CM | POA: Diagnosis not present

## 2023-01-16 DIAGNOSIS — E7800 Pure hypercholesterolemia, unspecified: Secondary | ICD-10-CM | POA: Diagnosis not present

## 2023-01-16 DIAGNOSIS — D631 Anemia in chronic kidney disease: Secondary | ICD-10-CM | POA: Diagnosis not present

## 2023-01-19 ENCOUNTER — Other Ambulatory Visit: Payer: Self-pay

## 2023-01-20 ENCOUNTER — Other Ambulatory Visit: Payer: Self-pay

## 2023-01-21 ENCOUNTER — Other Ambulatory Visit: Payer: Self-pay

## 2023-01-22 ENCOUNTER — Other Ambulatory Visit (HOSPITAL_COMMUNITY): Payer: Self-pay

## 2023-01-22 ENCOUNTER — Other Ambulatory Visit: Payer: Self-pay

## 2023-01-22 ENCOUNTER — Other Ambulatory Visit (HOSPITAL_BASED_OUTPATIENT_CLINIC_OR_DEPARTMENT_OTHER): Payer: Self-pay

## 2023-01-22 ENCOUNTER — Encounter (HOSPITAL_COMMUNITY): Payer: Self-pay

## 2023-01-22 DIAGNOSIS — E7800 Pure hypercholesterolemia, unspecified: Secondary | ICD-10-CM | POA: Diagnosis not present

## 2023-01-22 DIAGNOSIS — R972 Elevated prostate specific antigen [PSA]: Secondary | ICD-10-CM | POA: Diagnosis not present

## 2023-01-22 DIAGNOSIS — J984 Other disorders of lung: Secondary | ICD-10-CM | POA: Diagnosis not present

## 2023-01-22 DIAGNOSIS — F319 Bipolar disorder, unspecified: Secondary | ICD-10-CM | POA: Diagnosis not present

## 2023-01-22 DIAGNOSIS — N184 Chronic kidney disease, stage 4 (severe): Secondary | ICD-10-CM | POA: Diagnosis not present

## 2023-01-22 DIAGNOSIS — Z0000 Encounter for general adult medical examination without abnormal findings: Secondary | ICD-10-CM | POA: Diagnosis not present

## 2023-01-22 DIAGNOSIS — F250 Schizoaffective disorder, bipolar type: Secondary | ICD-10-CM | POA: Diagnosis not present

## 2023-01-22 DIAGNOSIS — I10 Essential (primary) hypertension: Secondary | ICD-10-CM | POA: Diagnosis not present

## 2023-01-22 DIAGNOSIS — Z23 Encounter for immunization: Secondary | ICD-10-CM | POA: Diagnosis not present

## 2023-01-22 DIAGNOSIS — N400 Enlarged prostate without lower urinary tract symptoms: Secondary | ICD-10-CM | POA: Diagnosis not present

## 2023-01-22 MED ORDER — FLUPHENAZINE DECANOATE 25 MG/ML IJ SOLN
12.5000 mg | INTRAMUSCULAR | 5 refills | Status: AC
  Filled 2023-01-22: qty 5, 21d supply, fill #0
  Filled 2023-01-22: qty 5, 28d supply, fill #0

## 2023-01-22 NOTE — Progress Notes (Addendum)
Specialty Pharmacy Initial Fill Coordination Note  Terry Padilla is a 62 y.o. male contacted today regarding refills of specialty medication(s) Fluphenazine Decanoate (Phenothiazines)   Patient requested Delivery   Delivery date: 01/27/23   Verified address: [facility] [address]   Medication will be filled on 10/21.   Patient is aware of $49.95 copayment.

## 2023-01-23 ENCOUNTER — Other Ambulatory Visit (HOSPITAL_COMMUNITY): Payer: Self-pay

## 2023-01-23 ENCOUNTER — Other Ambulatory Visit (HOSPITAL_BASED_OUTPATIENT_CLINIC_OR_DEPARTMENT_OTHER): Payer: Self-pay

## 2023-01-23 DIAGNOSIS — E7800 Pure hypercholesterolemia, unspecified: Secondary | ICD-10-CM | POA: Diagnosis not present

## 2023-01-23 DIAGNOSIS — N184 Chronic kidney disease, stage 4 (severe): Secondary | ICD-10-CM | POA: Diagnosis not present

## 2023-01-23 DIAGNOSIS — I10 Essential (primary) hypertension: Secondary | ICD-10-CM | POA: Diagnosis not present

## 2023-01-23 MED ORDER — OXCARBAZEPINE 300 MG PO TABS
300.0000 mg | ORAL_TABLET | Freq: Two times a day (BID) | ORAL | 2 refills | Status: DC
  Filled 2023-01-23: qty 60, 30d supply, fill #0
  Filled 2023-01-23: qty 180, 90d supply, fill #0
  Filled 2023-01-26: qty 60, 30d supply, fill #0

## 2023-01-26 ENCOUNTER — Other Ambulatory Visit (HOSPITAL_BASED_OUTPATIENT_CLINIC_OR_DEPARTMENT_OTHER): Payer: Self-pay

## 2023-01-26 ENCOUNTER — Other Ambulatory Visit: Payer: Self-pay

## 2023-01-26 ENCOUNTER — Other Ambulatory Visit (HOSPITAL_COMMUNITY): Payer: Self-pay

## 2023-01-27 ENCOUNTER — Other Ambulatory Visit: Payer: Self-pay

## 2023-01-28 ENCOUNTER — Other Ambulatory Visit (HOSPITAL_BASED_OUTPATIENT_CLINIC_OR_DEPARTMENT_OTHER): Payer: Self-pay

## 2023-01-28 ENCOUNTER — Other Ambulatory Visit: Payer: Self-pay

## 2023-01-28 MED ORDER — AMLODIPINE BESYLATE 10 MG PO TABS
10.0000 mg | ORAL_TABLET | Freq: Every day | ORAL | 3 refills | Status: DC
  Filled 2023-01-28: qty 30, 30d supply, fill #0

## 2023-01-28 MED ORDER — DOXAZOSIN MESYLATE 2 MG PO TABS
2.0000 mg | ORAL_TABLET | Freq: Every day | ORAL | 0 refills | Status: DC
  Filled 2023-01-28: qty 30, 30d supply, fill #0

## 2023-01-28 MED ORDER — ROSUVASTATIN CALCIUM 10 MG PO TABS
10.0000 mg | ORAL_TABLET | Freq: Every day | ORAL | 0 refills | Status: DC
  Filled 2023-01-28: qty 30, 30d supply, fill #0

## 2023-01-28 MED ORDER — DOXAZOSIN MESYLATE 2 MG PO TABS
2.0000 mg | ORAL_TABLET | Freq: Every day | ORAL | 3 refills | Status: DC
  Filled 2023-01-28: qty 30, 30d supply, fill #0

## 2023-01-28 MED ORDER — LISINOPRIL 20 MG PO TABS: 20.0000 mg | ORAL_TABLET | Freq: Every day | ORAL | 0 refills | Status: AC

## 2023-01-28 MED ORDER — AMLODIPINE BESYLATE 10 MG PO TABS: 10.0000 mg | ORAL_TABLET | Freq: Every day | ORAL | 0 refills | Status: AC

## 2023-01-28 MED ORDER — METOPROLOL SUCCINATE ER 50 MG PO TB24
50.0000 mg | ORAL_TABLET | Freq: Every day | ORAL | 3 refills | Status: DC
  Filled 2023-01-28: qty 30, 30d supply, fill #0

## 2023-01-28 MED ORDER — ROSUVASTATIN CALCIUM 10 MG PO TABS
10.0000 mg | ORAL_TABLET | Freq: Every day | ORAL | 3 refills | Status: DC
  Filled 2023-01-28: qty 30, 30d supply, fill #0

## 2023-01-28 MED ORDER — METOPROLOL SUCCINATE ER 50 MG PO TB24
50.0000 mg | ORAL_TABLET | Freq: Every day | ORAL | 0 refills | Status: AC
  Filled 2023-01-28 – 2023-04-28 (×2): qty 30, 30d supply, fill #0

## 2023-01-29 ENCOUNTER — Other Ambulatory Visit: Payer: Self-pay

## 2023-01-30 ENCOUNTER — Other Ambulatory Visit: Payer: Self-pay

## 2023-01-30 DIAGNOSIS — F250 Schizoaffective disorder, bipolar type: Secondary | ICD-10-CM | POA: Diagnosis not present

## 2023-01-30 NOTE — Progress Notes (Signed)
Patient Disenrolled per patient request.

## 2023-02-02 ENCOUNTER — Other Ambulatory Visit: Payer: Self-pay

## 2023-02-17 DIAGNOSIS — F250 Schizoaffective disorder, bipolar type: Secondary | ICD-10-CM | POA: Diagnosis not present

## 2023-02-23 ENCOUNTER — Other Ambulatory Visit (HOSPITAL_BASED_OUTPATIENT_CLINIC_OR_DEPARTMENT_OTHER): Payer: Self-pay

## 2023-02-23 MED ORDER — DOXAZOSIN MESYLATE 2 MG PO TABS
2.0000 mg | ORAL_TABLET | Freq: Every day | ORAL | 0 refills | Status: DC
  Filled 2023-02-23: qty 90, 90d supply, fill #0

## 2023-02-23 MED ORDER — METOPROLOL SUCCINATE ER 50 MG PO TB24
50.0000 mg | ORAL_TABLET | Freq: Every day | ORAL | 0 refills | Status: AC
  Filled 2023-02-23 – 2024-02-23 (×2): qty 90, 90d supply, fill #0

## 2023-02-23 MED ORDER — LISINOPRIL 20 MG PO TABS
20.0000 mg | ORAL_TABLET | Freq: Every day | ORAL | 0 refills | Status: AC
  Filled 2023-02-23: qty 90, 90d supply, fill #0

## 2023-02-23 MED ORDER — AMLODIPINE BESYLATE 10 MG PO TABS
10.0000 mg | ORAL_TABLET | Freq: Every day | ORAL | 0 refills | Status: AC
  Filled 2023-02-23: qty 90, 90d supply, fill #0

## 2023-02-23 MED ORDER — ROSUVASTATIN CALCIUM 10 MG PO TABS
10.0000 mg | ORAL_TABLET | Freq: Every day | ORAL | 0 refills | Status: DC
  Filled 2023-02-23: qty 90, 90d supply, fill #0

## 2023-02-24 ENCOUNTER — Other Ambulatory Visit (HOSPITAL_BASED_OUTPATIENT_CLINIC_OR_DEPARTMENT_OTHER): Payer: Self-pay

## 2023-02-24 MED ORDER — OXCARBAZEPINE 300 MG PO TABS
300.0000 mg | ORAL_TABLET | Freq: Two times a day (BID) | ORAL | 1 refills | Status: AC
  Filled 2023-02-24: qty 180, 90d supply, fill #0
  Filled 2023-04-28 – 2023-11-17 (×3): qty 180, 90d supply, fill #1

## 2023-03-11 DIAGNOSIS — F250 Schizoaffective disorder, bipolar type: Secondary | ICD-10-CM | POA: Diagnosis not present

## 2023-04-06 DIAGNOSIS — F250 Schizoaffective disorder, bipolar type: Secondary | ICD-10-CM | POA: Diagnosis not present

## 2023-04-23 DIAGNOSIS — F250 Schizoaffective disorder, bipolar type: Secondary | ICD-10-CM | POA: Diagnosis not present

## 2023-04-29 ENCOUNTER — Other Ambulatory Visit (HOSPITAL_BASED_OUTPATIENT_CLINIC_OR_DEPARTMENT_OTHER): Payer: Self-pay

## 2023-04-29 ENCOUNTER — Other Ambulatory Visit: Payer: Self-pay

## 2023-05-12 ENCOUNTER — Other Ambulatory Visit: Payer: Self-pay

## 2023-05-12 ENCOUNTER — Other Ambulatory Visit (HOSPITAL_BASED_OUTPATIENT_CLINIC_OR_DEPARTMENT_OTHER): Payer: Self-pay

## 2023-05-12 DIAGNOSIS — F250 Schizoaffective disorder, bipolar type: Secondary | ICD-10-CM | POA: Diagnosis not present

## 2023-05-12 DIAGNOSIS — Z79899 Other long term (current) drug therapy: Secondary | ICD-10-CM | POA: Diagnosis not present

## 2023-05-12 MED ORDER — ROSUVASTATIN CALCIUM 10 MG PO TABS
10.0000 mg | ORAL_TABLET | Freq: Every day | ORAL | 0 refills | Status: AC
  Filled 2023-05-12: qty 90, 90d supply, fill #0
  Filled 2023-05-18: qty 90, fill #0
  Filled 2023-09-01: qty 90, 90d supply, fill #0

## 2023-05-12 MED ORDER — METOPROLOL SUCCINATE ER 50 MG PO TB24
50.0000 mg | ORAL_TABLET | Freq: Every day | ORAL | 0 refills | Status: AC
  Filled 2023-05-12: qty 90, 90d supply, fill #0

## 2023-05-12 MED ORDER — DOXAZOSIN MESYLATE 2 MG PO TABS
2.0000 mg | ORAL_TABLET | Freq: Every day | ORAL | 0 refills | Status: AC
  Filled 2023-05-12: qty 90, 90d supply, fill #0

## 2023-05-12 MED ORDER — OXCARBAZEPINE 300 MG PO TABS
300.0000 mg | ORAL_TABLET | Freq: Two times a day (BID) | ORAL | 1 refills | Status: AC
  Filled 2023-05-12: qty 180, 90d supply, fill #0
  Filled 2023-08-25 – 2023-08-26 (×2): qty 180, 90d supply, fill #1

## 2023-05-12 MED ORDER — LISINOPRIL 20 MG PO TABS
20.0000 mg | ORAL_TABLET | Freq: Every day | ORAL | 0 refills | Status: AC
  Filled 2023-05-12: qty 90, 90d supply, fill #0

## 2023-05-12 MED ORDER — AMLODIPINE BESYLATE 10 MG PO TABS
10.0000 mg | ORAL_TABLET | Freq: Every day | ORAL | 0 refills | Status: AC
  Filled 2023-05-12: qty 90, 90d supply, fill #0

## 2023-05-12 NOTE — Progress Notes (Signed)
 Psychiatry  Established Patient E&M Service    Name: Terry Padilla Date; 05/19/17 MRN: [id number] DOB: 10-26-1960 PCP: PCP Unknown Per Patient  20 Minutes Medication Check for Bipolar 1 with psychosis    HPI:  05/12/23 Office visit. He missed his last appointments due to weather and illness.  He is doing well. He is not depressed, anxious, manic  psychotic.   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    Mood good. He will be seeing his nephrologist next week and will send me a copy of his labs.  His appetite and sleep are unchanged. He wants to continue same meds; Oxcarbazepine  300 mg bid and Prolixin  injection 12.5 mg q 21 days.  I will ref[facility] meds [facility]. [facility]car[facility]ine  to med center.  I will see him back in 6 months. MSE Appearance  appears stated age, well nourished, Well developed  and clean   Behavior  calm and cooperative Motor  No abnormal movements  Speech normal volume and normal rate Mood  euthymic Affect  appropriate Thought process  logical, goal directed Thought content no SI, no hi, no delusions. no halluc Orientation   alert Insight good jmt good     10/21/22 Office visit.  He is doing well.  He is very happy with this combination of medications.He does shares that sometimes he can not look straight ahead but, will look to the side.  I looked up the side effects of prolixin  and there were no documented visual side effects.  He states that even though he has some difficulties he does not want to change meds.  He is happy  MSE Appearance  appears stated age, well nourished, Well developed  and clean   Behavior  calm and cooperative Motor  No abnormal movements  Speech normal volume and normal rate Mood  euthymic Affect  appropriate Thought process  logical, goal directed Thought content no SI, no hi, no delusions. no halluc Orientation   alert Insight good jmt good  04/24/22 Patient is being seen in the office.  Patient is doing well.  He  continues to work and it is doing well.  He denies depression,anxiety, manic and psychotic.  His mood is good.  He continues to see his Nephrologist and he is doing well.  His appetite and sleep is good.  I will continue his trileptal  300 mg bid and prolixin  decanonate 1 2.5 mg injection every 3 weeks.  I will see her back in 6 months or sooner if needed. MSE Appearance  appears stated age, well nourished, Well developed  and clean  Behavior  calm and cooperative Motor  No abnormal movements  Speech normal volume and normal rate Mood  euthymic Affect  appropriate Thought process  logical, goal directed Thought content no SI, no hi, no delusions. no halluc Orientation   alert Insight good jmt good     10/29/21 Patient is being seen in the office.  Patient shares that he is going to [place] to see family for 9 days.  Patient states he is doing well.  He is not depressed, manic or psychotic. His mood is stable.  He is very quite.   His business is doing well.  He states he is stable by his nephrologist.  His appetite and sleep are stable.  I will see him back in 6 months or sooner if needed.        05/07/21 Patient is being seen in the office. Patient states he is doing  well.  He denies any psychosis, mania or depression. His business is doing well.  His mood is good.   He continues to take Trileptal  300 mg bid and Prolixin  injection 12.5 mg every 3 weeks .  He wants to continue his present medications.  He continues to be followed by a Mudlogger at [facility].  I have asked him to ask his nephrologist a copy of his labs.  I will see him back in 6 months  Please note I have received records from his Nephrologist which include office notes and labs  .  Will have them scanned into patients chart.  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       Patient states his medications are working.  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  He is seeing a nephrologist at [facility] where he is seen every 6 months .  I asked him to send me his labs so I will have a copy of them.  He showed me the labs on his phone dated 04/16/20 PTH HIgh 85 , WBC 6.3, RBC 4.85, HGB 14.6, HCT 42.8, MCV 88, MCHC 34.1, RDW 12.3,Neurtrophils 56, monophils, 8, EOS 2, Basophils 1, Glucose 83, Creatinine , eGFR african americal 38, BUN 10, sodium 144, potassium 4.1, cloride 103.  He is not having any problems with psychosis.   Patient continues to take Prolixin  12.5 q 21 days and trileptal  300 mg q d.   and is requesting refills.  He wants to follow up in 6 months or sooner if needed.     He  is not having any problems with hearing or seeing things. He states he is doing pretty good.  Patient  continues to take  and Prolix 12.5 mg every 21 days and trileptal  300 mg bid .    Appetite is good.  Sleeping well at night.  Denies SI/HI.              Stressors: none  Medications/Allergies: No Known Allergies  Medical History/Surgical History/Social history:Reviewed No past medical history on file.  Objective:  Vitals:  There were no vitals filed for this visit.  Mental Status Exam: Appearance:    Appears stated age, Well nourished and Well developed  Motor:   Restless  Speech/Language:    Normal rate, volume, tone, fluency  Mood:   manic  Affect:   congruent  Thought process:   Logical, linear, clear, coherent, goal directed  Thought content:     Denies SI, HI, self harm, delusions, obsessions, paranoid ideation, or ideas of reference  Perceptual disturbances:     Denies auditory and visual hallucinations, behavior not concerning for response to internal stimuli Grandiose   Orientation:   Oriented to person, place, time, and general circumstances  Attention:   Able to fully attend  without fluctuations in consciousness  Concentration:   Able to fully concentrate and attend  Memory:   Immediate, short-term, long-term, and recall grossly intact   Fund of knowledge:    Consistent with level of education and development  Insight:     impeared  Judgment:    impeared  Impulse Control:  poor  Diagnoses:  No diagnosis found.  Assessment/Plan: Schizoaffective bipolar type    Plan:    Orders Placed This Encounter  Medications  .  OXcarbazepine  (TRILEPTAL ) 300 mg tablet    Sig: Take 1 tablet (300 mg total) by mouth 2 (two) times a day.    Dispense:  180 tablet    Refill:  1    Take 1 tab twice daily  . fluPHENAZINE  decanoate (PROLIXIN  DECANOATE) 25 mg/mL injection    Sig: Inject 0.5 mL (12.5 mg total) into the muscle every 21 days.    Dispense:  1 mL    Refill:  5   F/U 6 months     Discussed potential benefits, risks, and side effects of proposed treatment and patient verbalizes understanding, reviewed emergency procedures with patient and patient agrees to seek services as needed for worsening signs and symptoms, including going to the ER if suicidal intent or plan emerges. Patient will follow-up with this provider in or sooner if clinically indicated.    Continue with Prolixin  Dec and increase frequency to q 3 weeks F/u 6  months

## 2023-05-14 DIAGNOSIS — N1831 Chronic kidney disease, stage 3a: Secondary | ICD-10-CM | POA: Diagnosis not present

## 2023-05-18 ENCOUNTER — Other Ambulatory Visit (HOSPITAL_BASED_OUTPATIENT_CLINIC_OR_DEPARTMENT_OTHER): Payer: Self-pay

## 2023-05-20 DIAGNOSIS — R319 Hematuria, unspecified: Secondary | ICD-10-CM | POA: Diagnosis not present

## 2023-05-20 DIAGNOSIS — F319 Bipolar disorder, unspecified: Secondary | ICD-10-CM | POA: Diagnosis not present

## 2023-05-20 DIAGNOSIS — I129 Hypertensive chronic kidney disease with stage 1 through stage 4 chronic kidney disease, or unspecified chronic kidney disease: Secondary | ICD-10-CM | POA: Diagnosis not present

## 2023-05-20 DIAGNOSIS — E7800 Pure hypercholesterolemia, unspecified: Secondary | ICD-10-CM | POA: Diagnosis not present

## 2023-05-20 DIAGNOSIS — D631 Anemia in chronic kidney disease: Secondary | ICD-10-CM | POA: Diagnosis not present

## 2023-05-20 DIAGNOSIS — N1832 Chronic kidney disease, stage 3b: Secondary | ICD-10-CM | POA: Diagnosis not present

## 2023-05-20 DIAGNOSIS — E559 Vitamin D deficiency, unspecified: Secondary | ICD-10-CM | POA: Diagnosis not present

## 2023-05-22 ENCOUNTER — Other Ambulatory Visit (HOSPITAL_BASED_OUTPATIENT_CLINIC_OR_DEPARTMENT_OTHER): Payer: Self-pay

## 2023-06-02 DIAGNOSIS — F250 Schizoaffective disorder, bipolar type: Secondary | ICD-10-CM | POA: Diagnosis not present

## 2023-06-08 ENCOUNTER — Other Ambulatory Visit (HOSPITAL_BASED_OUTPATIENT_CLINIC_OR_DEPARTMENT_OTHER): Payer: Self-pay

## 2023-06-08 MED ORDER — ROSUVASTATIN CALCIUM 10 MG PO TABS
10.0000 mg | ORAL_TABLET | Freq: Every day | ORAL | 0 refills | Status: AC
  Filled 2023-06-08: qty 90, 90d supply, fill #0

## 2023-06-23 DIAGNOSIS — F250 Schizoaffective disorder, bipolar type: Secondary | ICD-10-CM | POA: Diagnosis not present

## 2023-06-30 ENCOUNTER — Other Ambulatory Visit (HOSPITAL_BASED_OUTPATIENT_CLINIC_OR_DEPARTMENT_OTHER): Payer: Self-pay

## 2023-06-30 ENCOUNTER — Other Ambulatory Visit: Payer: Self-pay

## 2023-06-30 MED ORDER — METOPROLOL SUCCINATE ER 50 MG PO TB24
50.0000 mg | ORAL_TABLET | Freq: Every day | ORAL | 0 refills | Status: AC
  Filled 2023-06-30: qty 90, 90d supply, fill #0

## 2023-07-06 DIAGNOSIS — M1711 Unilateral primary osteoarthritis, right knee: Secondary | ICD-10-CM | POA: Diagnosis not present

## 2023-07-14 DIAGNOSIS — Z8639 Personal history of other endocrine, nutritional and metabolic disease: Secondary | ICD-10-CM | POA: Diagnosis not present

## 2023-07-14 DIAGNOSIS — F250 Schizoaffective disorder, bipolar type: Secondary | ICD-10-CM | POA: Diagnosis not present

## 2023-07-14 DIAGNOSIS — F319 Bipolar disorder, unspecified: Secondary | ICD-10-CM | POA: Diagnosis not present

## 2023-07-14 DIAGNOSIS — N400 Enlarged prostate without lower urinary tract symptoms: Secondary | ICD-10-CM | POA: Diagnosis not present

## 2023-07-14 DIAGNOSIS — M25561 Pain in right knee: Secondary | ICD-10-CM | POA: Diagnosis not present

## 2023-07-14 DIAGNOSIS — I10 Essential (primary) hypertension: Secondary | ICD-10-CM | POA: Diagnosis not present

## 2023-07-14 DIAGNOSIS — Z23 Encounter for immunization: Secondary | ICD-10-CM | POA: Diagnosis not present

## 2023-07-14 DIAGNOSIS — J302 Other seasonal allergic rhinitis: Secondary | ICD-10-CM | POA: Diagnosis not present

## 2023-07-14 DIAGNOSIS — E7800 Pure hypercholesterolemia, unspecified: Secondary | ICD-10-CM | POA: Diagnosis not present

## 2023-07-14 DIAGNOSIS — N184 Chronic kidney disease, stage 4 (severe): Secondary | ICD-10-CM | POA: Diagnosis not present

## 2023-07-14 NOTE — Progress Notes (Signed)
 VS taken at injection visit: BP: 128/83 HR : 70

## 2023-08-04 DIAGNOSIS — F250 Schizoaffective disorder, bipolar type: Secondary | ICD-10-CM | POA: Diagnosis not present

## 2023-08-20 ENCOUNTER — Other Ambulatory Visit: Payer: Self-pay

## 2023-08-20 ENCOUNTER — Other Ambulatory Visit (HOSPITAL_BASED_OUTPATIENT_CLINIC_OR_DEPARTMENT_OTHER): Payer: Self-pay

## 2023-08-20 MED ORDER — LISINOPRIL 20 MG PO TABS
20.0000 mg | ORAL_TABLET | Freq: Every day | ORAL | 0 refills | Status: AC
  Filled 2023-08-20: qty 90, 90d supply, fill #0

## 2023-08-20 MED ORDER — AMLODIPINE BESYLATE 10 MG PO TABS
10.0000 mg | ORAL_TABLET | Freq: Every day | ORAL | 0 refills | Status: AC
  Filled 2023-08-20: qty 90, 90d supply, fill #0

## 2023-08-20 MED ORDER — DOXAZOSIN MESYLATE 2 MG PO TABS
2.0000 mg | ORAL_TABLET | Freq: Every day | ORAL | 0 refills | Status: AC
  Filled 2023-08-20: qty 90, 90d supply, fill #0

## 2023-08-20 MED ORDER — ROSUVASTATIN CALCIUM 10 MG PO TABS
10.0000 mg | ORAL_TABLET | Freq: Every day | ORAL | 0 refills | Status: AC
  Filled 2023-08-20 – 2024-02-23 (×2): qty 90, 90d supply, fill #0

## 2023-08-20 MED ORDER — METOPROLOL SUCCINATE ER 50 MG PO TB24
50.0000 mg | ORAL_TABLET | Freq: Every day | ORAL | 0 refills | Status: AC
  Filled 2023-08-20 – 2023-09-17 (×2): qty 90, 90d supply, fill #0

## 2023-08-25 ENCOUNTER — Other Ambulatory Visit (HOSPITAL_COMMUNITY): Payer: Self-pay

## 2023-08-25 ENCOUNTER — Other Ambulatory Visit: Payer: Self-pay

## 2023-08-25 DIAGNOSIS — F250 Schizoaffective disorder, bipolar type: Secondary | ICD-10-CM | POA: Diagnosis not present

## 2023-08-26 ENCOUNTER — Encounter (HOSPITAL_COMMUNITY): Payer: Self-pay

## 2023-08-26 ENCOUNTER — Other Ambulatory Visit (HOSPITAL_COMMUNITY): Payer: Self-pay

## 2023-08-26 ENCOUNTER — Other Ambulatory Visit (HOSPITAL_BASED_OUTPATIENT_CLINIC_OR_DEPARTMENT_OTHER): Payer: Self-pay

## 2023-08-26 DIAGNOSIS — R040 Epistaxis: Secondary | ICD-10-CM | POA: Diagnosis not present

## 2023-08-26 DIAGNOSIS — I10 Essential (primary) hypertension: Secondary | ICD-10-CM | POA: Diagnosis not present

## 2023-08-26 DIAGNOSIS — M25561 Pain in right knee: Secondary | ICD-10-CM | POA: Diagnosis not present

## 2023-08-26 MED ORDER — DOXAZOSIN MESYLATE 2 MG PO TABS
2.0000 mg | ORAL_TABLET | Freq: Every day | ORAL | 3 refills | Status: AC
  Filled 2023-08-26 – 2023-11-12 (×2): qty 90, 90d supply, fill #0
  Filled 2024-02-23: qty 90, 90d supply, fill #1

## 2023-08-27 ENCOUNTER — Other Ambulatory Visit (HOSPITAL_COMMUNITY): Payer: Self-pay

## 2023-08-27 ENCOUNTER — Other Ambulatory Visit (HOSPITAL_BASED_OUTPATIENT_CLINIC_OR_DEPARTMENT_OTHER): Payer: Self-pay

## 2023-09-01 ENCOUNTER — Other Ambulatory Visit (HOSPITAL_BASED_OUTPATIENT_CLINIC_OR_DEPARTMENT_OTHER): Payer: Self-pay

## 2023-09-09 DIAGNOSIS — R040 Epistaxis: Secondary | ICD-10-CM | POA: Diagnosis not present

## 2023-09-09 NOTE — Progress Notes (Signed)
 Otolaryngology Clinic Note  HPI:    New Patient (Pt presents for recurrent epistaxis, last one a couple of weeks ago. )    Terry Padilla is a 63 y.o. male who presents as a new consult, referred by Vernon Velna Ranks, MD, for evaluation and treatment of epistaxis.  Approximately 2 months ago, he experienced a left-sided nosebleed while in bed. He promptly went to the bathroom and attempted to halt the bleeding using tissue paper. The bleeding ceased after some time. A similar episode occurred 2.5 weeks ago, which was severe enough to awaken him from sleep. 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 This was followed by another episode the subsequent day at home. His wife, a Publishing rights manager, recommended the application of an ointment, which he has been applying every 3 days. Since then, he has not experienced any further episodes of nosebleeds. He is not currently on any anticoagulant therapy and reports no recent nasal trauma or injury. He also does not have any new allergy symptoms such as itchy or watery eyes, or a runny nose.  PMH/Meds/All/SocHx/FamHx/ROS:   History reviewed. No pertinent past medical history.  History reviewed. No pertinent surgical history.  No family history of bleeding disorders, wound healing problems or difficulty with anesthesia.   Social History   Socioeconomic History  . Marital status: Legally Separated    Spouse name: Not on file  . Number of children: Not on file  . Years of education: Not on file  . Highest education level: Not on file  Occupational History  . Not on file  Tobacco Use  . Smoking status: Never  . Smokeless tobacco: Never  Substance and Sexual Activity  . Alcohol use: Never  . Drug use: Not on file  . Sexual activity: Not on file  Other Topics Concern  . Not on file  Social History Narrative  . Not on file    Social Drivers of Health   Food Insecurity: Not on file  Transportation Needs: Not on file  Safety: Not on file  Living Situation: Not on file    Current Outpatient Medications: .  amLODIPine  (NORVASC ) 10 mg tablet, Take 10 mg by mouth daily., Disp: , Rfl: .  doxazosin  (CARDURA ) 2 mg tablet, Take 2 mg by mouth., Disp: , Rfl: .  OXcarbazepine  (TRILEPTAL ) 300 mg tablet, Take 1 tablet (300 mg total) by mouth 2 (two) times a day., Disp: 180 tablet, Rfl: 1.  rosuvastatin  (CRESTOR ) 10 mg tablet, Take 10 mg by mouth daily., Disp: , Rfl: .  fluPHENAZINE  decanoate (PROLIXIN  DECANOATE) 25 mg/mL injection, Inject 0.5 mL (12.5 mg total) into the muscle every 21 days. (Patient not taking: Reported on 09/09/2023), Disp: 1 mL, Rfl: 5 Current Facility-Administered Medications: .  fluPHENAZINE  decanoate (PROLIXIN  DECANOATE) injection 12.5 mg, 12.5 mg, intramuscular, Q21 Days, [facility], NP, 12.5 mg at 08/25/23 0809  A complete ROS was performed with pertinent positives/negatives noted in the HPI. The remainder of the ROS are negative.    Physical Exam:    BP 118/79   Pulse 69   Temp 97.7 F (36.5 C) (Temporal)   Ht 1.905 m (6' 3)   Wt 98.9 kg (218 lb)   BMI 27.25 kg/m    General: Well developed, well nourished. No acute distress.   Head/Face: Normocephalic, atraumatic. No scars or lesions. No sinus tenderness. Facial nerve intact  and equal bilaterally.   Eyes: Pupils are equal, round and reactive to light. Conjunctiva and lids are normal. Normal extraocular mobility.   Ears:   Right: Pinna and external meatus normal   Left: Pinna and external meatus normal  Nose: No gross deformity or lesions. No purulent discharge. Septum normal, prominent blood vessels or active bleeding noted.  No significant crusting or scabbing.  Turbinate hypertrophy with normal mucosa.  Mouth/Oropharynx: Lips normal, teeth and gums normal with good dentition, normal oral vestibule. Normal floor of mouth, tongue  and oral mucosa, no mucosal lesions, ulcer or mass, normal tongue mobility. Uvula midline. Hard and soft palate normal with normal mobility. Symmetric tonsils, no erythema or exudate.  Larynx: See TFL if applicable  Nasopharynx: See TFL if applicable  Neck: Trachea midline. No masses. No crepitus. Normal thyroid  glad palpation without thyromegaly or nodules palpated. Salivary glands normal to palpation without swelling, erythema or mass.   Lymphatic: No lymphadenopathy in the neck.  Respiratory: No stridor or distress.  Extremities: No edema or cyanosis. Warm and well-perfused.  Neurologic: CN II-XII intact. Alert and oriented to self, place and time.  Normal reflexes and motor skills, balance and coordination. Moving all extremities without gross abnormality.  Psychiatric:  No unusual anxiety or evidence of depression. Appropriate affect.    Independent Review of Additional Tests or Records:  Documentation from referring provider reviewed  Procedures:  None  Impression & Plans:  [name] is a 63 y.o. male with:  1. Epistaxis. The patient's history aligns with recurrent epistaxis, likely due to nasal dryness. Examination today did not reveal any blood vessels requiring cauterization, suggesting the responsible vessel may have resolved. Continual application of an emollient, such as Vaseline, to the nostrils daily before bedtime is recommended to maintain moisture and prevent recurrence. In the event of another episode, Afrin should be used on a cotton ball, inserted into the bleeding nostril, and firm pressure applied for approximately 10 minutes to stop the bleeding. Afrin should only be used during bleeding episodes due to potential risks associated with repetitive use. A handout detailing these instructions was provided. If the condition recurs despite these measures, a follow-up visit for potential cauterization is advised.  Meghan Jenkins Shope, DO Otolaryngology

## 2023-09-15 DIAGNOSIS — F250 Schizoaffective disorder, bipolar type: Secondary | ICD-10-CM | POA: Diagnosis not present

## 2023-09-16 DIAGNOSIS — R351 Nocturia: Secondary | ICD-10-CM | POA: Diagnosis not present

## 2023-09-16 DIAGNOSIS — M25561 Pain in right knee: Secondary | ICD-10-CM | POA: Diagnosis not present

## 2023-09-16 DIAGNOSIS — N401 Enlarged prostate with lower urinary tract symptoms: Secondary | ICD-10-CM | POA: Diagnosis not present

## 2023-09-17 ENCOUNTER — Other Ambulatory Visit (HOSPITAL_BASED_OUTPATIENT_CLINIC_OR_DEPARTMENT_OTHER): Payer: Self-pay

## 2023-09-23 ENCOUNTER — Other Ambulatory Visit: Payer: Self-pay | Admitting: Urology

## 2023-09-23 DIAGNOSIS — R972 Elevated prostate specific antigen [PSA]: Secondary | ICD-10-CM | POA: Diagnosis not present

## 2023-09-23 DIAGNOSIS — R310 Gross hematuria: Secondary | ICD-10-CM | POA: Diagnosis not present

## 2023-09-23 DIAGNOSIS — N401 Enlarged prostate with lower urinary tract symptoms: Secondary | ICD-10-CM | POA: Diagnosis not present

## 2023-09-23 DIAGNOSIS — R351 Nocturia: Secondary | ICD-10-CM | POA: Diagnosis not present

## 2023-10-06 DIAGNOSIS — F250 Schizoaffective disorder, bipolar type: Secondary | ICD-10-CM | POA: Diagnosis not present

## 2023-10-07 DIAGNOSIS — N1831 Chronic kidney disease, stage 3a: Secondary | ICD-10-CM | POA: Diagnosis not present

## 2023-10-08 DIAGNOSIS — M25561 Pain in right knee: Secondary | ICD-10-CM | POA: Diagnosis not present

## 2023-10-13 DIAGNOSIS — R319 Hematuria, unspecified: Secondary | ICD-10-CM | POA: Diagnosis not present

## 2023-10-13 DIAGNOSIS — D631 Anemia in chronic kidney disease: Secondary | ICD-10-CM | POA: Diagnosis not present

## 2023-10-13 DIAGNOSIS — E1122 Type 2 diabetes mellitus with diabetic chronic kidney disease: Secondary | ICD-10-CM | POA: Diagnosis not present

## 2023-10-13 DIAGNOSIS — I129 Hypertensive chronic kidney disease with stage 1 through stage 4 chronic kidney disease, or unspecified chronic kidney disease: Secondary | ICD-10-CM | POA: Diagnosis not present

## 2023-10-13 DIAGNOSIS — N1831 Chronic kidney disease, stage 3a: Secondary | ICD-10-CM | POA: Diagnosis not present

## 2023-10-25 ENCOUNTER — Ambulatory Visit
Admission: RE | Admit: 2023-10-25 | Discharge: 2023-10-25 | Disposition: A | Discharge status: Home / Self Care | Source: Ambulatory Visit | Attending: Urology | Admitting: Urology

## 2023-10-25 DIAGNOSIS — R972 Elevated prostate specific antigen [PSA]: Secondary | ICD-10-CM

## 2023-10-25 DIAGNOSIS — N4289 Other specified disorders of prostate: Secondary | ICD-10-CM | POA: Diagnosis not present

## 2023-10-25 MED ORDER — GADOPICLENOL 0.5 MMOL/ML IV SOLN
10.0000 mL | Freq: Once | INTRAVENOUS | Status: AC | PRN
  Administered 2023-10-25: 10 mL via INTRAVENOUS

## 2023-10-28 DIAGNOSIS — F250 Schizoaffective disorder, bipolar type: Secondary | ICD-10-CM | POA: Diagnosis not present

## 2023-11-10 ENCOUNTER — Other Ambulatory Visit (HOSPITAL_BASED_OUTPATIENT_CLINIC_OR_DEPARTMENT_OTHER): Payer: Self-pay

## 2023-11-10 ENCOUNTER — Other Ambulatory Visit: Payer: Self-pay

## 2023-11-10 MED ORDER — LISINOPRIL 20 MG PO TABS
20.0000 mg | ORAL_TABLET | Freq: Every day | ORAL | 3 refills | Status: AC
  Filled 2023-11-10: qty 90, 90d supply, fill #0
  Filled 2024-02-23: qty 90, 90d supply, fill #1

## 2023-11-10 MED ORDER — METOPROLOL SUCCINATE ER 50 MG PO TB24
50.0000 mg | ORAL_TABLET | Freq: Every day | ORAL | 3 refills | Status: AC
  Filled 2023-11-10: qty 90, 90d supply, fill #0

## 2023-11-10 MED ORDER — AMLODIPINE BESYLATE 10 MG PO TABS
10.0000 mg | ORAL_TABLET | Freq: Every day | ORAL | 3 refills | Status: AC
  Filled 2023-11-10: qty 90, 90d supply, fill #0
  Filled 2024-02-23: qty 90, 90d supply, fill #1

## 2023-11-10 MED ORDER — ROSUVASTATIN CALCIUM 10 MG PO TABS
10.0000 mg | ORAL_TABLET | Freq: Every day | ORAL | 3 refills | Status: AC
  Filled 2023-11-10 – 2023-12-02 (×2): qty 90, 90d supply, fill #0

## 2023-11-12 ENCOUNTER — Other Ambulatory Visit (HOSPITAL_BASED_OUTPATIENT_CLINIC_OR_DEPARTMENT_OTHER): Payer: Self-pay

## 2023-11-17 ENCOUNTER — Other Ambulatory Visit (HOSPITAL_BASED_OUTPATIENT_CLINIC_OR_DEPARTMENT_OTHER): Payer: Self-pay

## 2023-11-17 ENCOUNTER — Other Ambulatory Visit: Payer: Self-pay

## 2023-11-17 ENCOUNTER — Other Ambulatory Visit (HOSPITAL_COMMUNITY): Payer: Self-pay

## 2023-11-17 DIAGNOSIS — F250 Schizoaffective disorder, bipolar type: Secondary | ICD-10-CM | POA: Diagnosis not present

## 2023-11-17 MED ORDER — OXCARBAZEPINE 300 MG PO TABS
300.0000 mg | ORAL_TABLET | Freq: Two times a day (BID) | ORAL | 1 refills | Status: AC
  Filled 2023-11-17 – 2024-02-23 (×2): qty 180, 90d supply, fill #0

## 2023-11-17 MED ORDER — FLUPHENAZINE DECANOATE 25 MG/ML IJ SOLN
12.5000 mg | INTRAMUSCULAR | 5 refills | Status: DC
  Filled 2023-11-17: qty 5, 21d supply, fill #0
  Filled 2024-05-05: qty 5, 42d supply, fill #0

## 2023-11-17 NOTE — Progress Notes (Signed)
 Pharmacy Patient Advocate Encounter  Insurance verification completed.   The patient is insured through [employer]-Ins does not cover medication. Patient paying out of pocket.   Ran test claim for Fluphenazine . Co-pay is $47.39.  This test claim was processed through [facility]- copay amounts may vary at other pharmacies due to pharmacy/plan contracts, or as the patient moves through the different stages of their insurance plan.

## 2023-11-17 NOTE — Progress Notes (Signed)
 [facility] for [facility]. [facility] will [facility] since [facility] are [facility]. Office will send new prescription. Dis-enrolling.

## 2023-11-18 ENCOUNTER — Other Ambulatory Visit (HOSPITAL_BASED_OUTPATIENT_CLINIC_OR_DEPARTMENT_OTHER): Payer: Self-pay

## 2023-11-30 ENCOUNTER — Other Ambulatory Visit (HOSPITAL_BASED_OUTPATIENT_CLINIC_OR_DEPARTMENT_OTHER): Payer: Self-pay

## 2023-11-30 MED ORDER — DOXYCYCLINE HYCLATE 100 MG PO TABS
100.0000 mg | ORAL_TABLET | Freq: Every day | ORAL | 0 refills | Status: AC
  Filled 2023-11-30: qty 40, 40d supply, fill #0

## 2023-12-02 ENCOUNTER — Other Ambulatory Visit (HOSPITAL_BASED_OUTPATIENT_CLINIC_OR_DEPARTMENT_OTHER): Payer: Self-pay

## 2023-12-02 MED ORDER — METOPROLOL SUCCINATE ER 50 MG PO TB24
50.0000 mg | ORAL_TABLET | Freq: Every day | ORAL | 3 refills | Status: AC
  Filled 2023-12-02: qty 90, 90d supply, fill #0

## 2023-12-08 DIAGNOSIS — F250 Schizoaffective disorder, bipolar type: Secondary | ICD-10-CM | POA: Diagnosis not present

## 2023-12-29 DIAGNOSIS — F250 Schizoaffective disorder, bipolar type: Secondary | ICD-10-CM | POA: Diagnosis not present

## 2024-01-08 DIAGNOSIS — R5383 Other fatigue: Secondary | ICD-10-CM | POA: Diagnosis not present

## 2024-01-08 DIAGNOSIS — R6883 Chills (without fever): Secondary | ICD-10-CM | POA: Diagnosis not present

## 2024-01-21 DIAGNOSIS — F250 Schizoaffective disorder, bipolar type: Secondary | ICD-10-CM | POA: Diagnosis not present

## 2024-02-03 DIAGNOSIS — E7800 Pure hypercholesterolemia, unspecified: Secondary | ICD-10-CM | POA: Diagnosis not present

## 2024-02-03 DIAGNOSIS — N184 Chronic kidney disease, stage 4 (severe): Secondary | ICD-10-CM | POA: Diagnosis not present

## 2024-02-03 DIAGNOSIS — Z0000 Encounter for general adult medical examination without abnormal findings: Secondary | ICD-10-CM | POA: Diagnosis not present

## 2024-02-03 DIAGNOSIS — R972 Elevated prostate specific antigen [PSA]: Secondary | ICD-10-CM | POA: Diagnosis not present

## 2024-02-03 DIAGNOSIS — M25561 Pain in right knee: Secondary | ICD-10-CM | POA: Diagnosis not present

## 2024-02-03 DIAGNOSIS — N400 Enlarged prostate without lower urinary tract symptoms: Secondary | ICD-10-CM | POA: Diagnosis not present

## 2024-02-03 DIAGNOSIS — F319 Bipolar disorder, unspecified: Secondary | ICD-10-CM | POA: Diagnosis not present

## 2024-02-03 DIAGNOSIS — J302 Other seasonal allergic rhinitis: Secondary | ICD-10-CM | POA: Diagnosis not present

## 2024-02-03 DIAGNOSIS — I10 Essential (primary) hypertension: Secondary | ICD-10-CM | POA: Diagnosis not present

## 2024-02-03 DIAGNOSIS — F250 Schizoaffective disorder, bipolar type: Secondary | ICD-10-CM | POA: Diagnosis not present

## 2024-02-03 DIAGNOSIS — R369 Urethral discharge, unspecified: Secondary | ICD-10-CM | POA: Diagnosis not present

## 2024-02-08 DIAGNOSIS — N1831 Chronic kidney disease, stage 3a: Secondary | ICD-10-CM | POA: Diagnosis not present

## 2024-02-09 DIAGNOSIS — F250 Schizoaffective disorder, bipolar type: Secondary | ICD-10-CM | POA: Diagnosis not present

## 2024-02-12 DIAGNOSIS — R809 Proteinuria, unspecified: Secondary | ICD-10-CM | POA: Diagnosis not present

## 2024-02-12 DIAGNOSIS — N1831 Chronic kidney disease, stage 3a: Secondary | ICD-10-CM | POA: Diagnosis not present

## 2024-02-12 DIAGNOSIS — D631 Anemia in chronic kidney disease: Secondary | ICD-10-CM | POA: Diagnosis not present

## 2024-02-12 DIAGNOSIS — R319 Hematuria, unspecified: Secondary | ICD-10-CM | POA: Diagnosis not present

## 2024-02-12 DIAGNOSIS — E1122 Type 2 diabetes mellitus with diabetic chronic kidney disease: Secondary | ICD-10-CM | POA: Diagnosis not present

## 2024-02-12 DIAGNOSIS — I129 Hypertensive chronic kidney disease with stage 1 through stage 4 chronic kidney disease, or unspecified chronic kidney disease: Secondary | ICD-10-CM | POA: Diagnosis not present

## 2024-02-23 ENCOUNTER — Other Ambulatory Visit: Payer: Self-pay

## 2024-02-23 ENCOUNTER — Other Ambulatory Visit (HOSPITAL_BASED_OUTPATIENT_CLINIC_OR_DEPARTMENT_OTHER): Payer: Self-pay

## 2024-03-01 DIAGNOSIS — F250 Schizoaffective disorder, bipolar type: Secondary | ICD-10-CM | POA: Diagnosis not present

## 2024-03-22 DIAGNOSIS — F250 Schizoaffective disorder, bipolar type: Secondary | ICD-10-CM | POA: Diagnosis not present

## 2024-05-05 ENCOUNTER — Other Ambulatory Visit: Payer: Self-pay

## 2024-05-05 ENCOUNTER — Other Ambulatory Visit (HOSPITAL_COMMUNITY): Payer: Self-pay

## 2024-05-05 ENCOUNTER — Other Ambulatory Visit (HOSPITAL_BASED_OUTPATIENT_CLINIC_OR_DEPARTMENT_OTHER): Payer: Self-pay

## 2024-05-05 MED ORDER — FLUPHENAZINE DECANOATE 25 MG/ML IJ SOLN
12.5000 mg | INTRAMUSCULAR | 5 refills | Status: AC
  Filled 2024-05-05 – 2024-05-06 (×3): qty 5, 42d supply, fill #0

## 2024-05-05 MED ORDER — FLUPHENAZINE DECANOATE 25 MG/ML IJ SOLN
INTRAMUSCULAR | 5 refills | Status: DC
  Filled 2024-05-05 (×3): qty 5, 42d supply, fill #0

## 2024-05-05 NOTE — Progress Notes (Signed)
 Specialty Pharmacy Initial Fill Coordination Note  Terry Padilla is a 64 y.o. male contacted today regarding initial fill of specialty medication(s) fluPHENAZine  Decanoate (PROLIXIN )   Patient requested Delivery   Delivery date: 05/10/24   Verified address: [facility] [address]   Medication will be filled on: 05/09/24   Patient is aware of $38.62 copayment.

## 2024-05-05 NOTE — Progress Notes (Signed)
 Patient called in to request price for Prolixin . Medication is not covered by patient's insurance. Confirmed with Margo that NDC: 25021-838-05 is $38.62 using price001 and can be ordered.   Office aware we are sending medication

## 2024-05-06 ENCOUNTER — Other Ambulatory Visit: Payer: Self-pay

## 2024-05-06 ENCOUNTER — Other Ambulatory Visit (HOSPITAL_COMMUNITY): Payer: Self-pay

## 2024-05-09 ENCOUNTER — Other Ambulatory Visit: Payer: Self-pay

## 2024-05-10 ENCOUNTER — Other Ambulatory Visit: Payer: Self-pay

## 2024-05-11 ENCOUNTER — Other Ambulatory Visit: Payer: Self-pay

## 2024-05-11 ENCOUNTER — Encounter: Payer: Self-pay | Admitting: Pharmacist

## 2024-05-12 ENCOUNTER — Other Ambulatory Visit: Payer: Self-pay
